# Patient Record
Sex: Female | Born: 1980 | Race: Black or African American | Hispanic: No | Marital: Single | Smoking: Current every day smoker
Health system: Southern US, Community
[De-identification: ages and names within clinical notes are randomized; demographics above are authoritative.]

## PROBLEM LIST (undated history)

## (undated) DIAGNOSIS — J45909 Unspecified asthma, uncomplicated: Secondary | ICD-10-CM

## (undated) HISTORY — PX: TUBAL LIGATION: SHX77

---

## 2018-03-17 ENCOUNTER — Other Ambulatory Visit: Payer: Self-pay

## 2018-03-17 ENCOUNTER — Emergency Department: Payer: Self-pay

## 2018-03-17 ENCOUNTER — Encounter: Payer: Self-pay | Admitting: *Deleted

## 2018-03-17 DIAGNOSIS — R091 Pleurisy: Secondary | ICD-10-CM | POA: Insufficient documentation

## 2018-03-17 DIAGNOSIS — F17210 Nicotine dependence, cigarettes, uncomplicated: Secondary | ICD-10-CM | POA: Insufficient documentation

## 2018-03-17 DIAGNOSIS — R0789 Other chest pain: Secondary | ICD-10-CM | POA: Insufficient documentation

## 2018-03-17 DIAGNOSIS — J45909 Unspecified asthma, uncomplicated: Secondary | ICD-10-CM | POA: Insufficient documentation

## 2018-03-17 LAB — CBC
HEMATOCRIT: 35.3 % (ref 35.0–47.0)
HEMOGLOBIN: 11.7 g/dL — AB (ref 12.0–16.0)
MCH: 26.1 pg (ref 26.0–34.0)
MCHC: 33.2 g/dL (ref 32.0–36.0)
MCV: 78.8 fL — AB (ref 80.0–100.0)
Platelets: 309 10*3/uL (ref 150–440)
RBC: 4.48 MIL/uL (ref 3.80–5.20)
RDW: 16.6 % — ABNORMAL HIGH (ref 11.5–14.5)
WBC: 9.4 10*3/uL (ref 3.6–11.0)

## 2018-03-17 LAB — POCT PREGNANCY, URINE: PREG TEST UR: NEGATIVE

## 2018-03-17 LAB — BASIC METABOLIC PANEL
ANION GAP: 7 (ref 5–15)
BUN: 14 mg/dL (ref 6–20)
CHLORIDE: 105 mmol/L (ref 101–111)
CO2: 24 mmol/L (ref 22–32)
Calcium: 9 mg/dL (ref 8.9–10.3)
Creatinine, Ser: 0.75 mg/dL (ref 0.44–1.00)
GFR calc Af Amer: >60 mL/min (ref >60)
GFR calc non Af Amer: >60 mL/min (ref >60)
GLUCOSE: 87 mg/dL (ref 65–99)
POTASSIUM: 3.9 mmol/L (ref 3.5–5.1)
SODIUM: 136 mmol/L (ref 135–145)

## 2018-03-17 LAB — TROPONIN I: Troponin I: <0.03 ng/mL (ref <0.03)

## 2018-03-17 NOTE — ED Triage Notes (Signed)
Pt reports midline chest pain when she woke up around 3pm, no sob or dizziness. Pain changes with movement.

## 2018-03-18 ENCOUNTER — Emergency Department
Admission: EM | Admit: 2018-03-18 | Discharge: 2018-03-18 | Disposition: A | Discharge status: Home / Self Care | Payer: Self-pay | Attending: Emergency Medicine | Admitting: Emergency Medicine

## 2018-03-18 DIAGNOSIS — R091 Pleurisy: Secondary | ICD-10-CM

## 2018-03-18 DIAGNOSIS — R079 Chest pain, unspecified: Secondary | ICD-10-CM

## 2018-03-18 HISTORY — DX: Unspecified asthma, uncomplicated: J45.909

## 2018-03-18 MED ORDER — KETOROLAC TROMETHAMINE 10 MG PO TABS
10.0000 mg | ORAL_TABLET | Freq: Once | ORAL | Status: AC
  Administered 2018-03-18: 10 mg via ORAL
  Filled 2018-03-18: qty 1

## 2018-03-18 MED ORDER — IBUPROFEN 400 MG PO TABS: 400.0000 mg | ORAL_TABLET | Freq: Once | ORAL | Status: DC

## 2018-03-18 NOTE — ED Provider Notes (Signed)
[facility] Emergency Department Provider Note   First MD Initiated Contact with Patient 03/18/18 [id number]     (approximate)  I have reviewed the triage vital signs and the nursing notes.   HISTORY  Chief Complaint Chest Pain   HPI Theresa Drake is a 37 y.o. female the emergency department with history of awakening at 3 PM yesterday afternoon with midline chest discomfort that is worse with movement.  Patient states that the pain is nonradiating.  Patient denies any dyspnea no diaphoresis or dizziness.  Patient denies any palpitations.  Patient denies any lower extremity pain or swelling.   Past Medical History:  Diagnosis Date  . Asthma     There are no active problems to display for this patient.   Past Surgical History:  Procedure Laterality Date  . TUBAL LIGATION      Prior to Admission medications   Not on File    Allergies No known drug allergies No family history on file.  Social History Social History   Tobacco Use  . Smoking status: Current Every Day Smoker  Substance Use Topics  . Alcohol use: Yes  . Drug use: Never    Review of Systems Constitutional: No fever/chills Eyes: No visual changes. ENT: No sore throat. Cardiovascular: Positive for chest pain. Respiratory: Denies shortness of breath. Gastrointestinal: No abdominal pain.  No nausea, no vomiting.  No diarrhea.  No constipation. Genitourinary: Negative for dysuria. Musculoskeletal: Negative for neck pain.  Negative for back pain. Integumentary: Negative for rash. Neurological: Negative for headaches, focal weakness or numbness.  ____________________________________________   PHYSICAL EXAM:  VITAL SIGNS: ED Triage Vitals  Enc Vitals Group     BP 03/17/18 2254 (!) 144/94     Pulse Rate 03/17/18 2254 93     Resp 03/17/18 2254 18     Temp 03/17/18 2254 98.9 F (37.2 C)     Temp Source 03/17/18 2254 Oral     SpO2 03/17/18 2254 100 %     Weight 03/17/18 2255  123.4 kg (272 lb)     Height 03/17/18 2255 1.676 m (5\' 6" )     Head Circumference --      Peak Flow --      Pain Score 03/17/18 2320 7     Pain Loc --      Pain Edu? --      Excl. in GC? --     Constitutional: Alert and oriented. Well appearing and in no acute distress. Eyes: Conjunctivae are normal.  Head: Atraumatic. Mouth/Throat: Mucous membranes are moist.  Oropharynx non-erythematous. Neck: No stridor.  Cardiovascular: Normal rate, regular rhythm. Good peripheral circulation. Grossly normal heart sounds. Respiratory: Normal respiratory effort.  No retractions. Lungs CTAB. Gastrointestinal: Soft and nontender. No distention.  Musculoskeletal: No lower extremity tenderness nor edema. No gross deformities of extremities. Neurologic:  Normal speech and language. No gross focal neurologic deficits are appreciated.  Skin:  Skin is warm, dry and intact. No rash noted. Psychiatric: Mood and affect are normal. Speech and behavior are normal.  ____________________________________________   LABS (all labs ordered are listed, but only abnormal results are displayed)  Labs Reviewed  CBC - Abnormal; Notable for the following components:      Result Value   Hemoglobin 11.7 (*)    MCV 78.8 (*)    RDW 16.6 (*)    All other components within normal limits  BASIC METABOLIC PANEL  TROPONIN I  POC URINE PREG, ED  POCT PREGNANCY, URINE  ____________________________________________  EKG  ED ECG REPORT I, [place], the attending physician, personally viewed and interpreted this ECG.   Date: 03/18/2018  EKG Time: 10:46 PM  Rate: 97  Rhythm: Normal sinus rhythm  Axis: Normal  Intervals: Normal  ST&T Change: None  ____________________________________________  RADIOLOGY I, [place] Tola Meas, personally viewed and evaluated these images (plain radiographs) as part of my medical decision making, as well as reviewing the written report by the radiologist.  ED MD  interpretation: Mild peribronchial thickening of uncertain density per radiologist.  Official radiology report(s): Dg Chest 2 View  Result Date: 03/17/2018 CLINICAL DATA:  Acute chest pain. EXAM: CHEST - 2 VIEW COMPARISON:  None. FINDINGS: The cardiomediastinal silhouette is unremarkable. Mild peribronchial thickening noted. There is no evidence of focal airspace disease, pulmonary edema, suspicious pulmonary nodule/mass, pleural effusion, or pneumothorax. No acute bony abnormalities are identified. IMPRESSION: Mild peribronchial thickening of uncertain chronicity. No other significant abnormalities. Electronically Signed   By: Harmon Pier M.D.   On: 03/17/2018 23:48     Procedures   ____________________________________________   INITIAL IMPRESSION / ASSESSMENT AND PLAN / ED COURSE  As part of my medical decision making, I reviewed the following data within the electronic MEDICAL RECORD NUMBER   37 year old female presented with above-stated history and physical exam of central chest discomfort that was reproducible on exam.  Consider possibility of pleurisy or costochondritis which I suspect to be likely.  However also consider the possibility of CAD and as such EKG was performed which revealed no evidence of ischemia or infarction likewise troponin was negative.    ____________________________________________  FINAL CLINICAL IMPRESSION(S) / ED DIAGNOSES  Final diagnoses:  Pleurisy  Nonspecific chest pain     MEDICATIONS GIVEN DURING THIS VISIT:  Medications  ketorolac (TORADOL) tablet 10 mg (10 mg Oral Given 03/18/18 0429)     ED Discharge Orders    None       Note:  This document was prepared using Dragon voice recognition software and may include unintentional dictation errors.    [name], MD 03/18/18 [phone]

## 2018-06-30 ENCOUNTER — Emergency Department: Payer: Self-pay

## 2018-06-30 ENCOUNTER — Encounter: Payer: Self-pay | Admitting: Emergency Medicine

## 2018-06-30 ENCOUNTER — Other Ambulatory Visit: Payer: Self-pay

## 2018-06-30 ENCOUNTER — Emergency Department
Admission: EM | Admit: 2018-06-30 | Discharge: 2018-06-30 | Disposition: A | Discharge status: Home / Self Care | Payer: Self-pay | Attending: Emergency Medicine | Admitting: Emergency Medicine

## 2018-06-30 DIAGNOSIS — J45909 Unspecified asthma, uncomplicated: Secondary | ICD-10-CM | POA: Insufficient documentation

## 2018-06-30 DIAGNOSIS — M79672 Pain in left foot: Secondary | ICD-10-CM

## 2018-06-30 DIAGNOSIS — F17200 Nicotine dependence, unspecified, uncomplicated: Secondary | ICD-10-CM | POA: Insufficient documentation

## 2018-06-30 MED ORDER — DICLOFENAC SODIUM 75 MG PO TBEC: 75.0000 mg | DELAYED_RELEASE_TABLET | Freq: Two times a day (BID) | ORAL | 0 refills | Status: DC

## 2018-06-30 MED ORDER — DEXAMETHASONE SODIUM PHOSPHATE 10 MG/ML IJ SOLN
10.0000 mg | Freq: Once | INTRAMUSCULAR | Status: AC
  Administered 2018-06-30: 10 mg via INTRAMUSCULAR
  Filled 2018-06-30: qty 1

## 2018-06-30 NOTE — ED Notes (Signed)
Pt verbalizes understanding of discharge instructions.

## 2018-06-30 NOTE — ED Provider Notes (Signed)
[facility] Emergency Department Provider Note ____________________________________________  Time seen: 2155  I have reviewed the triage vital signs and the nursing notes.  HISTORY  Chief Complaint  Foot Pain and Leg Pain   HPI Theresa Drake is a 37 y.o. female's to the ER with complaints of left foot pain.  She reports this started yesterday.  She describes the pain as sharp and shooting.  The pain radiates up the side of her left calf.  She reports associated numbness, tingling and weakness in the left foot.  She is having pain with weightbearing.  She denies any injury to the area.  She has taken ibuprofen without any relief.  She denies any prior injuries or surgeries to the left foot or ankle.  Past Medical History:  Diagnosis Date  . Asthma     There are no active problems to display for this patient.   Past Surgical History:  Procedure Laterality Date  . TUBAL LIGATION      Prior to Admission medications   Medication Sig Start Date End Date Taking? Authorizing Provider  diclofenac (VOLTAREN) 75 MG EC tablet Take 1 tablet (75 mg total) by mouth 2 (two) times daily. 06/30/18   Lorre MunroeBaity, Regina W, NP    Allergies Patient has no known allergies.  No family history on file.  Social History Social History   Tobacco Use  . Smoking status: Current Every Day Smoker  Substance Use Topics  . Alcohol use: Yes  . Drug use: Never    Review of Systems  Constitutional: Negative for fever chills or body aches. Cardiovascular: Negative for swelling in the left calf.Marland Kitchen. Respiratory: Negative for shortness of breath. Musculoskeletal: Positive for left foot pain, difficulty with gait. Skin: Negative for redness, warmth. Neurological: Positive for numbness, tingling and weakness of the left lower extremity.   ____________________________________________  PHYSICAL EXAM:  VITAL SIGNS: ED Triage Vitals  Enc Vitals Group     BP 06/30/18 2039 128/74   Pulse Rate 06/30/18 2039 (!) 101     Resp 06/30/18 2039 20     Temp 06/30/18 2039 99.8 F (37.7 C)     Temp Source 06/30/18 2039 Oral     SpO2 06/30/18 2039 99 %     Weight 06/30/18 2040 286 lb 13.1 oz (130.1 kg)     Height 06/30/18 2040 5\' 6"  (1.676 m)     Head Circumference --      Peak Flow --      Pain Score 06/30/18 2040 9     Pain Loc --      Pain Edu? --      Excl. in GC? --     Constitutional: Alert and oriented. Obese, in no distress. Cardiovascular: Echocardiac, regular rhythm.  Pedal pulses 2+ bilaterally. Respiratory: Normal respiratory effort. No wheezes/rales/rhonchi. Musculoskeletal: Normal flexion, extension and rotation of the left ankle.  No pain with palpation of the left ankle.  No joint swelling noted.  Pain with palpation over the dorsal aspect of the left foot, arch.  No pain with palpation of the left heel.  She reports that she is not able to ambulate and will not try. Neurologic: Sensation intact to bilateral lower extremities. Skin:  Skin is warm, dry and intact. No redness or warmth noted. ____________________________________________   RADIOLOGY  Imaging Orders     DG Foot Complete Left  IMPRESSION: Negative. ____________________________________________  PROCEDURES  .Splint Application Date/Time: 06/30/2018 10:07 PM Performed by: Leticia ClasHayden, Rachel G, RN Authorized by: Sampson SiBaity,  Salvadore Oxford, NP   Consent:    Consent obtained:  Verbal   Consent given by:  Patient   Risks discussed:  Discoloration, numbness and pain   Alternatives discussed:  No treatment Pre-procedure details:    Sensation:  Normal Procedure details:    Laterality:  Left   Location:  Ankle   Ankle:  L ankle   Strapping: no     Splint type:  Ankle stirrup Post-procedure details:    Pain:  Improved   Sensation:  Normal   Patient tolerance of procedure:  Tolerated well, no immediate complications    ____________________________________________  INITIAL IMPRESSION / ASSESSMENT  AND PLAN / ED COURSE  Left Foot Pain:  DDX include fracture, sprain, plantar fasciitis, osteoarthritis Xray negative for acute fracture Decadron 10 mg IM today Stirrup ankle brace applied RX for Diclofenac 75 mg for pain and inflammation Encouraged rest, ice and elevation Work note provided  ____________________________________________  FINAL CLINICAL IMPRESSION(S) / ED DIAGNOSES  Final diagnoses:  Foot pain, left      Lorre Munroe, NP 06/30/18 2212    Dionne Bucy, MD 07/01/18 [phone]

## 2018-06-30 NOTE — Discharge Instructions (Signed)
You have been diagnosed with foot pain.  The x-ray of your left foot was negative for acute fracture.  We gave you a steroid injection today.  We have placed your left foot in an ankle brace for extra support.  I have provided you with a prescription for Diclofenac for pain and inflammation.  Avoid over-the-counter anti-inflammatories.  You can take Tylenol thousand milligrams every 8 hours as needed.

## 2018-06-30 NOTE — ED Triage Notes (Signed)
Pt reports pain to left foot reports has sharp pain that radiates to the leg, pt denies any injury to area. Pt talks in complete sentences no distress noted

## 2018-09-11 ENCOUNTER — Other Ambulatory Visit: Payer: Self-pay

## 2018-09-11 ENCOUNTER — Encounter: Payer: Self-pay | Admitting: Emergency Medicine

## 2018-09-11 ENCOUNTER — Emergency Department
Admission: EM | Admit: 2018-09-11 | Discharge: 2018-09-11 | Disposition: A | Discharge status: Home / Self Care | Payer: Self-pay | Attending: Emergency Medicine | Admitting: Emergency Medicine

## 2018-09-11 DIAGNOSIS — J45909 Unspecified asthma, uncomplicated: Secondary | ICD-10-CM | POA: Insufficient documentation

## 2018-09-11 DIAGNOSIS — Y93E5 Activity, floor mopping and cleaning: Secondary | ICD-10-CM | POA: Insufficient documentation

## 2018-09-11 DIAGNOSIS — S01412A Laceration without foreign body of left cheek and temporomandibular area, initial encounter: Secondary | ICD-10-CM | POA: Insufficient documentation

## 2018-09-11 DIAGNOSIS — Z79899 Other long term (current) drug therapy: Secondary | ICD-10-CM | POA: Insufficient documentation

## 2018-09-11 DIAGNOSIS — W25XXXA Contact with sharp glass, initial encounter: Secondary | ICD-10-CM | POA: Insufficient documentation

## 2018-09-11 DIAGNOSIS — Y998 Other external cause status: Secondary | ICD-10-CM | POA: Insufficient documentation

## 2018-09-11 DIAGNOSIS — S0181XA Laceration without foreign body of other part of head, initial encounter: Secondary | ICD-10-CM

## 2018-09-11 DIAGNOSIS — Y92008 Other place in unspecified non-institutional (private) residence as the place of occurrence of the external cause: Secondary | ICD-10-CM | POA: Insufficient documentation

## 2018-09-11 DIAGNOSIS — F17200 Nicotine dependence, unspecified, uncomplicated: Secondary | ICD-10-CM | POA: Insufficient documentation

## 2018-09-11 NOTE — Discharge Instructions (Addendum)
Keep the wound clean and dry. Avoid use of ointments, creams, or lotions over the wound glue.

## 2018-09-11 NOTE — ED Triage Notes (Signed)
Small laceration / abrasion to left upper cheek.  Bleeding controlled.

## 2018-09-11 NOTE — ED Provider Notes (Signed)
[facility] Emergency Department Provider Note ____________________________________________  Time seen: 1820  I have reviewed the triage vital signs and the nursing notes.  HISTORY  Chief Complaint  Laceration  HPI Theresa Drake is a 37 y.o. female who presents to the ED accompanied by her mother, for evaluation of small laceration to the left cheek.  Patient was at home cleaning and changing the bed sheets, when she accidentally knocked the glass lampshade off of the overhead light.  The glass shade fell hitting the patient on her left cheek.  She presents now with a small laceration to the cheek without active bleeding.  She denies any other injury at this time..  Past Medical History:  Diagnosis Date  . Asthma     There are no active problems to display for this patient.   Past Surgical History:  Procedure Laterality Date  . TUBAL LIGATION      Prior to Admission medications   Medication Sig Start Date End Date Taking? Authorizing Provider  diclofenac (VOLTAREN) 75 MG EC tablet Take 1 tablet (75 mg total) by mouth 2 (two) times daily. 06/30/18   Lorre MunroeBaity, Regina W, NP   Allergies Patient has no known allergies.  No family history on file.  Social History Social History   Tobacco Use  . Smoking status: Current Every Day Smoker  . Smokeless tobacco: Never Used  Substance Use Topics  . Alcohol use: Yes  . Drug use: Never    Review of Systems  Constitutional: Negative for fever. Eyes: Negative for visual changes. ENT: Negative for sore throat. Cardiovascular: Negative for chest pain. Respiratory: Negative for shortness of breath. Musculoskeletal: Negative for back pain. Skin: Negative for rash.  Facial laceration as above. Neurological: Negative for headaches, focal weakness or numbness. ____________________________________________  PHYSICAL EXAM:  VITAL SIGNS: ED Triage Vitals  Enc Vitals Group     BP 09/11/18 1802 126/85     Pulse  Rate 09/11/18 1802 95     Resp 09/11/18 1802 18     Temp 09/11/18 1802 98.7 F (37.1 C)     Temp Source 09/11/18 1802 Oral     SpO2 09/11/18 1802 99 %     Weight 09/11/18 1758 286 lb 13.1 oz (130.1 kg)     Height --      Head Circumference --      Peak Flow --      Pain Score 09/11/18 1757 3     Pain Loc --      Pain Edu? --      Excl. in GC? --     Constitutional: Alert and oriented. Well appearing and in no distress. Head: Normocephalic and atraumatic, except for a superficial, 1 cm laceration to the left malar cheek.  There is a small drop of blood noted in the fresh wound bed.  No other injuries appreciated.  Subtle swelling is noted.. Eyes: Conjunctivae are normal. PERRL. Normal extraocular movements Neck: Supple. Normal ROM Cardiovascular: Normal rate, regular rhythm. Normal distal pulses. Respiratory: Normal respiratory effort.  Musculoskeletal: Nontender with normal range of motion in all extremities.  Neurologic:  Normal gait without ataxia. Normal speech and language. No gross focal neurologic deficits are appreciated. Skin:  Skin is warm, dry and intact. No rash noted. ____________________________________________  PROCEDURES  .Marland Kitchen.Laceration Repair Date/Time: 09/11/2018 7:17 PM Performed by: Lissa HoardMenshew, Florentino Laabs V Bacon, PA-C Authorized by: Lissa HoardMenshew, Dewain Platz V Bacon, PA-C   Consent:    Consent obtained:  Verbal   Consent given by:  Patient   Risks discussed:  Poor cosmetic result and poor wound healing   Alternatives discussed:  No treatment Laceration details:    Location:  Face   Face location:  L cheek   Length (cm):  1.5 Repair type:    Repair type:  Simple Treatment:    Area cleansed with:  Soap and water   Amount of cleaning:  Standard Skin repair:    Repair method:  Tissue adhesive Approximation:    Approximation:  Close Post-procedure details:    Dressing:  Open (no dressing)   Patient tolerance of procedure:  Tolerated well, no immediate  complications  ____________________________________________  INITIAL IMPRESSION / ASSESSMENT AND PLAN / ED COURSE  She with ED evaluation of a minor facial contusion resulting in a superficial laceration to the left cheek.  Patient opted for wound repair and we decided this wound adhesive was the best option.  Patient is discharged with wound care instructions regarding wound adhesive.  She will follow-up with primary provider or return to the ED as needed. ____________________________________________  FINAL CLINICAL IMPRESSION(S) / ED DIAGNOSES  Final diagnoses:  Facial laceration, initial encounter      Lissa Hoard, PA-C 09/11/18 1918    Jeanmarie Plant, MD 09/13/18 [phone]

## 2018-09-11 NOTE — ED Notes (Signed)
See triage note  Presents with a small laceration to left cheek. Also has some tenderness to area  States she was fanning the sheets  ..hit a light and it feel on her face

## 2018-12-29 ENCOUNTER — Emergency Department: Payer: Self-pay

## 2018-12-29 ENCOUNTER — Other Ambulatory Visit: Payer: Self-pay

## 2018-12-29 ENCOUNTER — Emergency Department
Admission: EM | Admit: 2018-12-29 | Discharge: 2018-12-29 | Disposition: A | Discharge status: Home / Self Care | Payer: Self-pay | Attending: Emergency Medicine | Admitting: Emergency Medicine

## 2018-12-29 DIAGNOSIS — J45909 Unspecified asthma, uncomplicated: Secondary | ICD-10-CM | POA: Insufficient documentation

## 2018-12-29 DIAGNOSIS — F17200 Nicotine dependence, unspecified, uncomplicated: Secondary | ICD-10-CM | POA: Insufficient documentation

## 2018-12-29 DIAGNOSIS — R070 Pain in throat: Secondary | ICD-10-CM | POA: Insufficient documentation

## 2018-12-29 DIAGNOSIS — R0989 Other specified symptoms and signs involving the circulatory and respiratory systems: Secondary | ICD-10-CM | POA: Insufficient documentation

## 2018-12-29 DIAGNOSIS — Z0279 Encounter for issue of other medical certificate: Secondary | ICD-10-CM | POA: Insufficient documentation

## 2018-12-29 DIAGNOSIS — B349 Viral infection, unspecified: Secondary | ICD-10-CM | POA: Insufficient documentation

## 2018-12-29 LAB — BASIC METABOLIC PANEL
Anion gap: 8 (ref 5–15)
BUN: 10 mg/dL (ref 6–20)
CO2: 24 mmol/L (ref 22–32)
CREATININE: 0.8 mg/dL (ref 0.44–1.00)
Calcium: 8.8 mg/dL — ABNORMAL LOW (ref 8.9–10.3)
Chloride: 106 mmol/L (ref 98–111)
GFR calc Af Amer: >60 mL/min (ref >60)
GFR calc non Af Amer: >60 mL/min (ref >60)
Glucose, Bld: 110 mg/dL — ABNORMAL HIGH (ref 70–99)
Potassium: 3.8 mmol/L (ref 3.5–5.1)
Sodium: 138 mmol/L (ref 135–145)

## 2018-12-29 LAB — CBC
HEMATOCRIT: 35.3 % — AB (ref 36.0–46.0)
Hemoglobin: 11 g/dL — ABNORMAL LOW (ref 12.0–15.0)
MCH: 25.9 pg — ABNORMAL LOW (ref 26.0–34.0)
MCHC: 31.2 g/dL (ref 30.0–36.0)
MCV: 83.3 fL (ref 80.0–100.0)
Platelets: 288 10*3/uL (ref 150–400)
RBC: 4.24 MIL/uL (ref 3.87–5.11)
RDW: 17.5 % — AB (ref 11.5–15.5)
WBC: 6.9 10*3/uL (ref 4.0–10.5)
nRBC: 0 % (ref 0.0–0.2)

## 2018-12-29 LAB — POCT PREGNANCY, URINE: Preg Test, Ur: NEGATIVE

## 2018-12-29 LAB — TROPONIN I: Troponin I: <0.03 ng/mL (ref <0.03)

## 2018-12-29 NOTE — ED Notes (Signed)
Pt tolerated food well, states she feels a little better

## 2018-12-29 NOTE — ED Provider Notes (Signed)
[facility] Emergency Department Provider Note  ____________________________________________   I have reviewed the triage vital signs and the nursing notes. Where available I have reviewed prior notes and, if possible and indicated, outside hospital notes.    HISTORY  Chief Complaint No chief complaint on file.    HPI Theresa Drake is a 38 y.o. female  With a history of asthma, states that she began having a cough a few hours ago and left work because of it.  She also has a sore throat and runny nose.  No fever.  Sometimes is uncomfortable to cough but she has no other chest pain.  The cough is sometimes productive but mostly is not.  It is difficult for her to tell me too much about this because is only been going on for a few hours, she states.  When I initially entered the room she is very upset because she really had to urinate, and she was throwing all of the stuff on the floor, leads in blood pressure cuff, but shortly thereafter I was able to calm her down and we had a more reasonable discussion.  Patient states she needs a work note and she is very hungry and she wants [place] to feed her because she has not had any food since she went to work this morning.  She also complains of runny nose.  No recent travel no coronavirus exposure does not meet criteria currently for testing,    Past Medical History:  Diagnosis Date  . Asthma     There are no active problems to display for this patient.   Past Surgical History:  Procedure Laterality Date  . TUBAL LIGATION      Prior to Admission medications   Medication Sig Start Date End Date Taking? Authorizing Provider  diclofenac (VOLTAREN) 75 MG EC tablet Take 1 tablet (75 mg total) by mouth 2 (two) times daily. 06/30/18   Lorre Munroe, NP    Allergies Patient has no known allergies.  History reviewed. No pertinent family history.  Social History Social History   Tobacco Use  . Smoking status: Current  Every Day Smoker  . Smokeless tobacco: Never Used  Substance Use Topics  . Alcohol use: Yes  . Drug use: Never    Review of Systems Constitutional: No fever/chills Eyes: No visual changes. ENT: No sore throat. No stiff neck no neck pain Cardiovascular: Denies chest pain.  Positive "it hurts to cough sometimes" Respiratory: Denies shortness of breath.  Positive cough Gastrointestinal:   no vomiting.  No diarrhea.  No constipation. Genitourinary: Negative for dysuria. Musculoskeletal: Negative lower extremity swelling Skin: Negative for rash. Neurological: Negative for severe headaches, focal weakness or numbness.   ____________________________________________   PHYSICAL EXAM:  VITAL SIGNS: ED Triage Vitals  Enc Vitals Group     BP 12/29/18 1052 125/87     Pulse Rate 12/29/18 1052 81     Resp 12/29/18 1052 18     Temp 12/29/18 1052 98.2 F (36.8 C)     Temp src --      SpO2 12/29/18 1052 99 %     Weight 12/29/18 1046 278 lb (126.1 kg)     Height 12/29/18 1046 5\' 6"  (1.676 m)     Head Circumference --      Peak Flow --      Pain Score 12/29/18 1046 7     Pain Loc --      Pain Edu? --  Excl. in GC? --     Constitutional: Alert and oriented. Well appearing and in no acute distress. Eyes: Conjunctivae are normal Head: Atraumatic HEENT: + congestion/rhinnorhea. Mucous membranes are moist.  Oropharynx non-erythematous Neck:   Nontender with no meningismus, no masses, no stridor Cardiovascular: Normal rate, regular rhythm. Grossly normal heart sounds.  Good peripheral circulation. Respiratory: Normal respiratory effort.  No retractions. Lungs CTAB. Abdominal: Soft and nontender. No distention. No guarding no rebound Back:  There is no focal tenderness or step off.  there is no midline tenderness there are no lesions noted. there is no CVA tenderness Musculoskeletal: No lower extremity tenderness, no upper extremity tenderness. No joint effusions, no DVT signs strong  distal pulses no edema Neurologic:  Normal speech and language. No gross focal neurologic deficits are appreciated.  Skin:  Skin is warm, dry and intact. No rash noted. Psychiatric: Mood and affect are normal. Speech and behavior are normal.  ____________________________________________   LABS (all labs ordered are listed, but only abnormal results are displayed)  Labs Reviewed  BASIC METABOLIC PANEL - Abnormal; Notable for the following components:      Result Value   Glucose, Bld 110 (*)    Calcium 8.8 (*)    All other components within normal limits  CBC - Abnormal; Notable for the following components:   Hemoglobin 11.0 (*)    HCT 35.3 (*)    MCH 25.9 (*)    RDW 17.5 (*)    All other components within normal limits  TROPONIN I  POC URINE PREG, ED  POCT PREGNANCY, URINE    Pertinent labs  results that were available during my care of the patient were reviewed by me and considered in my medical decision making (see chart for details). ____________________________________________  EKG  I personally interpreted any EKGs ordered by me or triage Sinus rhythm, rate 81 bpm no acute ST elevation or depression nonspecific ST changes no acute ischemia changed essentially from June 2019 ____________________________________________  RADIOLOGY  Pertinent labs & imaging results that were available during my care of the patient were reviewed by me and considered in my medical decision making (see chart for details). If possible, patient and/or family made aware of any abnormal findings.  Dg Chest 2 View  Result Date: 12/29/2018 CLINICAL DATA:  Chest pain and shortness of breath EXAM: CHEST - 2 VIEW COMPARISON:  03/17/2018 FINDINGS: Normal heart size and mediastinal contours. No acute infiltrate or edema. No effusion or pneumothorax. No acute osseous findings. Irregular density over the right neck is likely from the patient's hair. IMPRESSION: No active cardiopulmonary disease.  Electronically Signed   By: Marnee Spring M.D.   On: 12/29/2018 11:12   ____________________________________________    PROCEDURES  Procedure(s) performed: None  Procedures  Critical Care performed: None  ____________________________________________   INITIAL IMPRESSION / ASSESSMENT AND PLAN / ED COURSE  Pertinent labs & imaging results that were available during my care of the patient were reviewed by me and considered in my medical decision making (see chart for details).  Patient here with cold symptoms for a few hours.  She is angry and would like to eat we will let her eat she is eager to go home she wants a work note.  At this time I see nothing more acute than the common cold.  Nothing to suggest ACS PE dissection myocarditis endocarditis pneumonia pneumothorax or intra-abdominal pathology causing her her symptoms.  Nor is there any indication for antibiotics.  Patient has a  few hours of URI symptoms.  She does not meet criteria for testing for coronavirus at this time, we will discharge her with return precautions and follow-up given and understood.    ____________________________________________   FINAL CLINICAL IMPRESSION(S) / ED DIAGNOSES  Final diagnoses:  None      This chart was dictated using voice recognition software.  Despite best efforts to proofread,  errors can occur which can change meaning.      Jeanmarie Plant, MD 12/29/18 1302

## 2018-12-29 NOTE — ED Notes (Signed)
Pt provided sandwich tray and sprite

## 2018-12-29 NOTE — ED Triage Notes (Signed)
Pt arrives to ED with mult complaints. Generalized CP, SOB, bronchitis, body pain, sore throat, cough. States symptoms began this AM. A&O, no distress noted.

## 2018-12-30 ENCOUNTER — Other Ambulatory Visit: Payer: Self-pay

## 2018-12-30 ENCOUNTER — Emergency Department
Admission: EM | Admit: 2018-12-30 | Discharge: 2018-12-30 | Disposition: A | Discharge status: Home / Self Care | Payer: Self-pay | Attending: Emergency Medicine | Admitting: Emergency Medicine

## 2018-12-30 ENCOUNTER — Encounter: Payer: Self-pay | Admitting: Emergency Medicine

## 2018-12-30 DIAGNOSIS — J45909 Unspecified asthma, uncomplicated: Secondary | ICD-10-CM | POA: Insufficient documentation

## 2018-12-30 DIAGNOSIS — F17210 Nicotine dependence, cigarettes, uncomplicated: Secondary | ICD-10-CM | POA: Insufficient documentation

## 2018-12-30 DIAGNOSIS — Z79899 Other long term (current) drug therapy: Secondary | ICD-10-CM | POA: Insufficient documentation

## 2018-12-30 DIAGNOSIS — J209 Acute bronchitis, unspecified: Secondary | ICD-10-CM | POA: Insufficient documentation

## 2018-12-30 LAB — INFLUENZA PANEL BY PCR (TYPE A & B)
Influenza A By PCR: NEGATIVE
Influenza B By PCR: NEGATIVE

## 2018-12-30 MED ORDER — ALBUTEROL SULFATE HFA 108 (90 BASE) MCG/ACT IN AERS: 2.0000 | INHALATION_SPRAY | RESPIRATORY_TRACT | 1 refills | Status: DC | PRN

## 2018-12-30 MED ORDER — PREDNISONE 10 MG PO TABS: 50.0000 mg | ORAL_TABLET | Freq: Every day | ORAL | 0 refills | Status: DC

## 2018-12-30 MED ORDER — PREDNISONE 20 MG PO TABS
60.0000 mg | ORAL_TABLET | Freq: Once | ORAL | Status: AC
  Administered 2018-12-30: 60 mg via ORAL
  Filled 2018-12-30: qty 3

## 2018-12-30 MED ORDER — IPRATROPIUM-ALBUTEROL 0.5-2.5 (3) MG/3ML IN SOLN
3.0000 mL | Freq: Once | RESPIRATORY_TRACT | Status: AC
  Administered 2018-12-30: 3 mL via RESPIRATORY_TRACT
  Filled 2018-12-30: qty 6

## 2018-12-30 MED ORDER — HYDROCOD POLST-CPM POLST ER 10-8 MG/5ML PO SUER
5.0000 mL | Freq: Once | ORAL | Status: AC
  Administered 2018-12-30: 5 mL via ORAL
  Filled 2018-12-30: qty 5

## 2018-12-30 MED ORDER — GUAIFENESIN-CODEINE 100-10 MG/5ML PO SYRP: 10.0000 mL | ORAL_SOLUTION | Freq: Three times a day (TID) | ORAL | 0 refills | Status: DC | PRN

## 2018-12-30 NOTE — Discharge Instructions (Addendum)
Please follow up with the primary care provider of your choice for symptoms that are not improving over the next few days. ° °Return to the ER for symptoms that change or worsen if unable to schedule an appointment. °

## 2018-12-30 NOTE — ED Notes (Signed)
Patient did not sign for discharge. Patient given papers while she was walkign down the hall to leave

## 2018-12-30 NOTE — ED Provider Notes (Signed)
[facility] Emergency Department Provider Note  ____________________________________________  Time seen: Approximately 2:11 PM  I have reviewed the triage vital signs and the nursing notes.   HISTORY  Chief Complaint Dizziness and Generalized Body Aches   HPI Theresa Drake is a 38 y.o. female who presents to the emergency department for treatment and evaluation of cough, chest tenderness with cough, and shortness of breath.  No fever that she is noted.   Symptoms started yesterday with a runny nose and sore throat and cough which has worsened.  She was evaluated here yesterday for the symptoms.  She states that she was not given any prescriptions yesterday.   Past Medical History:  Diagnosis Date  . Asthma     There are no active problems to display for this patient.   Past Surgical History:  Procedure Laterality Date  . TUBAL LIGATION      Prior to Admission medications   Medication Sig Start Date End Date Taking? Authorizing Provider  albuterol (PROVENTIL HFA;VENTOLIN HFA) 108 (90 Base) MCG/ACT inhaler Inhale 2 puffs into the lungs every 4 (four) hours as needed for wheezing or shortness of breath. 12/30/18   Kemper Heupel, Rulon Eisenmenger B, FNP  diclofenac (VOLTAREN) 75 MG EC tablet Take 1 tablet (75 mg total) by mouth 2 (two) times daily. 06/30/18   Lorre Munroe, NP  guaiFENesin-codeine (ROBITUSSIN AC) 100-10 MG/5ML syrup Take 10 mLs by mouth 3 (three) times daily as needed for cough. 12/30/18   Aleera Gilcrease B, FNP  predniSONE (DELTASONE) 10 MG tablet Take 5 tablets (50 mg total) by mouth daily. 12/30/18   Chinita Pester, FNP    Allergies Patient has no known allergies.  No family history on file.  Social History Social History   Tobacco Use  . Smoking status: Current Every Day Smoker  . Smokeless tobacco: Never Used  Substance Use Topics  . Alcohol use: Yes  . Drug use: Never    Review of Systems Constitutional: Negative for fever/chills.   Normal appetite. ENT: Positive for sore throat. Cardiovascular: Denies chest pain. Respiratory: Positive for shortness of breath.  Positive for cough.  Positive for wheezing.  Gastrointestinal: Negative for nausea, no vomiting.  No diarrhea.  Musculoskeletal: Negative for body aches Skin: Negative for rash. Neurological: Negative for headaches ____________________________________________   PHYSICAL EXAM:  VITAL SIGNS: ED Triage Vitals [12/30/18 1301]  Enc Vitals Group     BP (!) 140/99     Pulse Rate 81     Resp 16     Temp 98.2 F (36.8 C)     Temp Source Oral     SpO2 96 %     Weight 278 lb (126.1 kg)     Height 5\' 6"  (1.676 m)     Head Circumference      Peak Flow      Pain Score 9     Pain Loc      Pain Edu?      Excl. in GC?     Constitutional: Alert and oriented.  Acutely ill appearing and in no acute distress. Eyes: Conjunctivae are normal. Ears: Bilateral tympanic membranes are normal Nose: Maxillary sinus congestion noted; no rhinnorhea. Mouth/Throat: Mucous membranes are moist.  Oropharynx mildly erythematous. Tonsils not visualized. Uvula midline. Neck: No stridor.  Lymphatic: No cervical lymphadenopathy. Cardiovascular: Normal rate, regular rhythm. Good peripheral circulation. Respiratory: Respirations are even and unlabored.  No retractions.  Expiratory wheezes noted throughout. Gastrointestinal: Soft and nontender.  Musculoskeletal: FROM x  4 extremities.  Neurologic:  Normal speech and language. Skin:  Skin is warm, dry and intact. No rash noted. Psychiatric: Mood and affect are normal. Speech and behavior are normal.  ____________________________________________   LABS (all labs ordered are listed, but only abnormal results are displayed)  Labs Reviewed  INFLUENZA PANEL BY PCR (TYPE A & B)   ____________________________________________  EKG  Not indicated ____________________________________________  RADIOLOGY  Not  indicated ____________________________________________   PROCEDURES  Procedure(s) performed: None  Critical Care performed: No ____________________________________________   INITIAL IMPRESSION / ASSESSMENT AND PLAN / ED COURSE  38 y.o. female presents to the emergency department for treatment and evaluation of  cough, shortness of breath, chest pain with cough, and sore throat.  She has not had a fever that she is aware of.  She states that she feels like her asthma is "flaring up."  While here, the patient received a DuoNeb treatment, prednisone, and Tussionex.  Shortly after, patient reports to multiple staff members that she feels better would like to leave.  She will be given prescriptions for the prednisone, albuterol, and guaifenesin with codeine.  She was encouraged to return to the emergency department for symptoms of change or worsen if she is unable to see her primary care provider.  She was also given a work note for today and tomorrow.   Medications  ipratropium-albuterol (DUONEB) 0.5-2.5 (3) MG/3ML nebulizer solution 3 mL (3 mLs Nebulization Given 12/30/18 1433)  predniSONE (DELTASONE) tablet 60 mg (60 mg Oral Given 12/30/18 1433)  chlorpheniramine-HYDROcodone (TUSSIONEX) 10-8 MG/5ML suspension 5 mL (5 mLs Oral Given 12/30/18 1433)    ED Discharge Orders         Ordered    predniSONE (DELTASONE) 10 MG tablet  Daily     12/30/18 1548    albuterol (PROVENTIL HFA;VENTOLIN HFA) 108 (90 Base) MCG/ACT inhaler  Every 4 hours PRN     12/30/18 1548    guaiFENesin-codeine (ROBITUSSIN AC) 100-10 MG/5ML syrup  3 times daily PRN     12/30/18 1548           Pertinent labs & imaging results that were available during my care of the patient were reviewed by me and considered in my medical decision making (see chart for details).    If controlled substance prescribed during this visit, 12 month history viewed on the [place] prior to issuing an initial prescription for Schedule II or  III opiod. ____________________________________________   FINAL CLINICAL IMPRESSION(S) / ED DIAGNOSES  Final diagnoses:  Acute bronchitis, unspecified organism    Note:  This document was prepared using Dragon voice recognition software and may include unintentional dictation errors.    Chinita Pester, FNP 12/30/18 1734    Jene Every, MD 12/31/18 [phone]

## 2018-12-30 NOTE — ED Triage Notes (Signed)
Pt to ED via POV c/o generalized body aches, sore throat, chest pains, and shortness of breath. Pt states that she was seen yesterday but her symptoms are not any better. Pt is in NAD.

## 2019-02-14 ENCOUNTER — Encounter: Payer: Self-pay | Admitting: Emergency Medicine

## 2019-02-14 ENCOUNTER — Other Ambulatory Visit: Payer: Self-pay

## 2019-02-14 ENCOUNTER — Emergency Department
Admission: EM | Admit: 2019-02-14 | Discharge: 2019-02-14 | Disposition: A | Discharge status: Home / Self Care | Payer: Self-pay | Attending: Emergency Medicine | Admitting: Emergency Medicine

## 2019-02-14 DIAGNOSIS — J45909 Unspecified asthma, uncomplicated: Secondary | ICD-10-CM | POA: Insufficient documentation

## 2019-02-14 DIAGNOSIS — M79672 Pain in left foot: Secondary | ICD-10-CM | POA: Insufficient documentation

## 2019-02-14 DIAGNOSIS — F17210 Nicotine dependence, cigarettes, uncomplicated: Secondary | ICD-10-CM | POA: Insufficient documentation

## 2019-02-14 MED ORDER — KETOROLAC TROMETHAMINE 30 MG/ML IJ SOLN
30.0000 mg | Freq: Once | INTRAMUSCULAR | Status: AC
  Administered 2019-02-14: 30 mg via INTRAMUSCULAR
  Filled 2019-02-14: qty 1

## 2019-02-14 MED ORDER — KETOROLAC TROMETHAMINE 10 MG PO TABS: 10.0000 mg | ORAL_TABLET | Freq: Four times a day (QID) | ORAL | 0 refills | Status: DC | PRN

## 2019-02-14 MED ORDER — TRAMADOL HCL 50 MG PO TABS: 50.0000 mg | ORAL_TABLET | Freq: Four times a day (QID) | ORAL | 0 refills | Status: DC | PRN

## 2019-02-14 MED ORDER — LIDOCAINE 5 % EX PTCH: 1.0000 | MEDICATED_PATCH | CUTANEOUS | 0 refills | Status: DC

## 2019-02-14 NOTE — ED Triage Notes (Signed)
Pain left foot. No injury.

## 2019-02-14 NOTE — ED Notes (Signed)
See triage note  Presents with pain to left foot  Denies any injury but states she has intermittent pain and swelling for several weeks    States swelling decreases with elevation  No deformity noted  Good pulses

## 2019-02-14 NOTE — Discharge Instructions (Addendum)
Please take Toradol for inflammation in your foot.  You can take tramadol for extreme pain.  You may use Lidoderm patches to place over the top of your foot.  Use Ace wrap and brace.  Elevate foot today.  Please follow-up with podiatry for further evaluation and management.

## 2019-02-14 NOTE — ED Provider Notes (Signed)
[facility] Emergency Department Provider Note  ____________________________________________  Time seen: Approximately 11:13 AM  I have reviewed the triage vital signs and the nursing notes.   HISTORY  Chief Complaint Foot Pain    HPI Theresa Drake is a 38 y.o. female that presents emergency department for evaluation of intermittent right foot pain for several months.  Pain will start at the top of her foot and shoot into her ankle.  Pain is worse when she applies pressure to her foot.  Her daughter will elevate her foot sometimes at night.  Foot occasionally swells but this is alleviated when her foot is elevated.  Occasionally she will take Motrin for pain.  She had to call out of work today for pain.  Patient was seen for same in fall 2019 in this emergency department.  She had x-rays completed in the emergency department at this time.  She states that pain never improved.  She does not have a primary care provider.  She was not able to follow-up with anyone following her ED visit last year.  She was given a shoe in the emergency department but has lost it.  No calf pain, numbness, tingling.   Past Medical History:  Diagnosis Date  . Asthma     There are no active problems to display for this patient.   Past Surgical History:  Procedure Laterality Date  . TUBAL LIGATION      Prior to Admission medications   Medication Sig Start Date End Date Taking? Authorizing Provider  ketorolac (TORADOL) 10 MG tablet Take 1 tablet (10 mg total) by mouth every 6 (six) hours as needed. 02/14/19   Enid Derry, PA-C  lidocaine (LIDODERM) 5 % Place 1 patch onto the skin daily. Remove & Discard patch within 12 hours or as directed by MD 02/14/19   Enid Derry, PA-C  traMADol (ULTRAM) 50 MG tablet Take 1 tablet (50 mg total) by mouth every 6 (six) hours as needed. 02/14/19 02/14/20  Enid Derry, PA-C    Allergies Patient has no known allergies.  No family history  on file.  Social History Social History   Tobacco Use  . Smoking status: Current Every Day Smoker  . Smokeless tobacco: Never Used  Substance Use Topics  . Alcohol use: Yes  . Drug use: Never     Review of Systems  Gastrointestinal: No nausea, no vomiting.  Musculoskeletal: Positive for foot pain. Skin: Negative for rash, abrasions, lacerations, ecchymosis. Neurological: Negative for numbness or tingling   ____________________________________________   PHYSICAL EXAM:  VITAL SIGNS: ED Triage Vitals  Enc Vitals Group     BP 02/14/19 1056 131/87     Pulse Rate 02/14/19 1056 77     Resp 02/14/19 1056 18     Temp 02/14/19 1056 98.1 F (36.7 C)     Temp Source 02/14/19 1056 Oral     SpO2 02/14/19 1056 96 %     Weight 02/14/19 1056 278 lb (126.1 kg)     Height 02/14/19 1056 5\' 6"  (1.676 m)     Head Circumference --      Peak Flow --      Pain Score 02/14/19 1039 7     Pain Loc --      Pain Edu? --      Excl. in GC? --      Constitutional: Alert and oriented. Well appearing and in no acute distress. Eyes: Conjunctivae are normal. PERRL. EOMI. Head: Atraumatic. ENT:  Ears:      Nose: No congestion/rhinnorhea.      Mouth/Throat: Mucous membranes are moist.  Neck: No stridor.  Cardiovascular: Normal rate, regular rhythm.  Good peripheral circulation.  Symmetric pedal pulses bilaterally.  Cap refill less than 2 seconds. Respiratory: Normal respiratory effort without tachypnea or retractions. Lungs CTAB. Good air entry to the bases with no decreased or absent breath sounds. Musculoskeletal: Full range of motion to all extremities. No gross deformities appreciated.  Full range of motion of foot and ankle.  Mild tenderness to palpation to dorsal mid foot. Neurologic:  Normal speech and language. No gross focal neurologic deficits are appreciated.  Skin:  Skin is warm, dry and intact. No rash noted. Psychiatric: Mood and affect are normal. Speech and behavior are  normal. Patient exhibits appropriate insight and judgement.   ____________________________________________   LABS (all labs ordered are listed, but only abnormal results are displayed)  Labs Reviewed - No data to display ____________________________________________  EKG   ____________________________________________  RADIOLOGY   No results found.  ____________________________________________    PROCEDURES  Procedure(s) performed:    Procedures    Medications  ketorolac (TORADOL) 30 MG/ML injection 30 mg (30 mg Intramuscular Given 02/14/19 1124)     ____________________________________________   INITIAL IMPRESSION / ASSESSMENT AND PLAN / ED COURSE  Pertinent labs & imaging results that were available during my care of the patient were reviewed by me and considered in my medical decision making (see chart for details).  Review of the [place] CSRS was performed in accordance of the [facility] prior to dispensing any controlled drugs.     Patient presented to emergency department for evaluation of chronic intermittent foot pain.  Vital signs and exam are reassuring.  Patient had an x-ray completed last fall and is in agreement that x-ray will unlikely show any additional information since she has not had any trauma.  She was given IM Toradol for pain.  Postop shoe and Ace wrap were placed.  Patient will be discharged home with prescriptions for Toradol and a short course of Percocet. Patient is to follow up with podiatry as directed.  Referral was given to Dr. Alberteen Spindleline.  Patient is given ED precautions to return to the ED for any worsening or new symptoms.     ____________________________________________  FINAL CLINICAL IMPRESSION(S) / ED DIAGNOSES  Final diagnoses:  Left foot pain      NEW MEDICATIONS STARTED DURING THIS VISIT:  ED Discharge Orders         Ordered    traMADol (ULTRAM) 50 MG tablet  Every 6 hours PRN     02/14/19 1149    ketorolac (TORADOL) 10 MG  tablet  Every 6 hours PRN     02/14/19 1149    lidocaine (LIDODERM) 5 %  Every 24 hours     02/14/19 1149              This chart was dictated using voice recognition software/Dragon. Despite best efforts to proofread, errors can occur which can change the meaning. Any change was purely unintentional.    Enid DerryWagner, Latanya Hemmer, PA-C 02/14/19 1513    Emily FilbertWilliams, Jonathan E, MD 02/19/19 [phone]

## 2019-04-21 ENCOUNTER — Encounter: Payer: Self-pay | Admitting: Emergency Medicine

## 2019-04-21 ENCOUNTER — Emergency Department: Payer: No Typology Code available for payment source

## 2019-04-21 ENCOUNTER — Emergency Department
Admission: EM | Admit: 2019-04-21 | Discharge: 2019-04-21 | Disposition: A | Discharge status: Home / Self Care | Payer: No Typology Code available for payment source | Attending: Emergency Medicine | Admitting: Emergency Medicine

## 2019-04-21 ENCOUNTER — Other Ambulatory Visit: Payer: Self-pay

## 2019-04-21 DIAGNOSIS — Y998 Other external cause status: Secondary | ICD-10-CM | POA: Diagnosis not present

## 2019-04-21 DIAGNOSIS — M549 Dorsalgia, unspecified: Secondary | ICD-10-CM | POA: Insufficient documentation

## 2019-04-21 DIAGNOSIS — F17200 Nicotine dependence, unspecified, uncomplicated: Secondary | ICD-10-CM | POA: Diagnosis not present

## 2019-04-21 DIAGNOSIS — M542 Cervicalgia: Secondary | ICD-10-CM | POA: Diagnosis present

## 2019-04-21 DIAGNOSIS — Y9389 Activity, other specified: Secondary | ICD-10-CM | POA: Insufficient documentation

## 2019-04-21 DIAGNOSIS — Y92410 Unspecified street and highway as the place of occurrence of the external cause: Secondary | ICD-10-CM | POA: Diagnosis not present

## 2019-04-21 DIAGNOSIS — J45909 Unspecified asthma, uncomplicated: Secondary | ICD-10-CM | POA: Insufficient documentation

## 2019-04-21 MED ORDER — KETOROLAC TROMETHAMINE 30 MG/ML IJ SOLN
30.0000 mg | Freq: Once | INTRAMUSCULAR | Status: AC
  Administered 2019-04-21: 30 mg via INTRAMUSCULAR
  Filled 2019-04-21: qty 1

## 2019-04-21 MED ORDER — METHOCARBAMOL 500 MG PO TABS: 500.0000 mg | ORAL_TABLET | Freq: Three times a day (TID) | ORAL | 0 refills | Status: AC | PRN

## 2019-04-21 MED ORDER — MELOXICAM 15 MG PO TABS: 15.0000 mg | ORAL_TABLET | Freq: Every day | ORAL | 1 refills | Status: AC
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
## 2019-04-21 NOTE — ED Notes (Signed)
AAOx3.  Skin warm and dry.  NAD 

## 2019-09-05 ENCOUNTER — Other Ambulatory Visit: Payer: Self-pay

## 2019-09-05 ENCOUNTER — Emergency Department: Payer: Self-pay

## 2019-09-05 ENCOUNTER — Emergency Department
Admission: EM | Admit: 2019-09-05 | Discharge: 2019-09-05 | Disposition: A | Discharge status: Home / Self Care | Payer: Self-pay | Attending: Emergency Medicine | Admitting: Emergency Medicine

## 2019-09-05 DIAGNOSIS — F17210 Nicotine dependence, cigarettes, uncomplicated: Secondary | ICD-10-CM | POA: Insufficient documentation

## 2019-09-05 DIAGNOSIS — J069 Acute upper respiratory infection, unspecified: Secondary | ICD-10-CM | POA: Insufficient documentation

## 2019-09-05 DIAGNOSIS — J4520 Mild intermittent asthma, uncomplicated: Secondary | ICD-10-CM | POA: Insufficient documentation

## 2019-09-05 MED ORDER — ALBUTEROL SULFATE HFA 108 (90 BASE) MCG/ACT IN AERS: 2.0000 | INHALATION_SPRAY | Freq: Four times a day (QID) | RESPIRATORY_TRACT | 0 refills | Status: DC | PRN

## 2019-09-05 MED ORDER — HYDROCODONE-ACETAMINOPHEN 5-325 MG PO TABS
1.0000 | ORAL_TABLET | Freq: Once | ORAL | Status: AC
  Administered 2019-09-05: 1 via ORAL
  Filled 2019-09-05: qty 1

## 2019-09-05 MED ORDER — PREDNISONE 10 MG PO TABS: ORAL_TABLET | ORAL | 0 refills | Status: DC

## 2019-09-05 NOTE — Discharge Instructions (Signed)
Follow-up with your primary care provider if any continued problems or concerns.  You may also [facility] at [facility] or any urgent care if you do not have a primary care provider.  Discontinue or decrease smoking as this will aggravate your asthma.  Begin taking the prednisone as directed over the next 6 days.  Also an albuterol inhaler was sent to your pharmacy.  This is 2 puffs every 6 hours as needed for shortness of breath or wheezing.  You may also take Tylenol with this medication if needed for muscle aches, fever, headache.  Increase fluids.  Should you find out that you have been exposed to Covid you may go to the Covid test center at the visitor entrance of the hospital for a free Covid test.  You will remain in your car while the test is being done.

## 2019-09-05 NOTE — ED Provider Notes (Signed)
[facility] Emergency Department Provider Note   ____________________________________________   First MD Initiated Contact with Patient 09/05/19 1042     (approximate)  I have reviewed the triage vital signs and the nursing notes.   HISTORY  Chief Complaint Cough and Generalized Body Aches   HPI Theresa Drake is a 38 y.o. female presents to the ED with complaint of cough, wheezing, body aches without fever or chills.  Patient has a history of asthma and currently does not have an inhaler.  She also continues to smoke daily.  She denies any change in taste, smell, or exposure to known Covid.  Patient also denies any GI disturbances.  There is minimal cough.  She currently rates her body aches as an 8 out of 10.      Past Medical History:  Diagnosis Date  . Asthma     There are no active problems to display for this patient.   Past Surgical History:  Procedure Laterality Date  . TUBAL LIGATION      Prior to Admission medications   Medication Sig Start Date End Date Taking? Authorizing Provider  albuterol (VENTOLIN HFA) 108 (90 Base) MCG/ACT inhaler Inhale 2 puffs into the lungs every 6 (six) hours as needed for wheezing or shortness of breath. 09/05/19   Johnn Hai, PA-C  predniSONE (DELTASONE) 10 MG tablet Take 6 tablets  today, on day 2 take 5 tablets, day 3 take 4 tablets, day 4 take 3 tablets, day 5 take  2 tablets and 1 tablet the last day 09/05/19   Johnn Hai, PA-C    Allergies Patient has no known allergies.  No family history on file.  Social History Social History   Tobacco Use  . Smoking status: Current Every Day Smoker  . Smokeless tobacco: Never Used  Substance Use Topics  . Alcohol use: Yes  . Drug use: Never    Review of Systems Constitutional: No fever/chills Eyes: No visual changes. ENT: No sore throat.  Negative for ear pain. Cardiovascular: Denies chest pain. Respiratory: Denies shortness of breath.   Positive for wheezing. Gastrointestinal: No abdominal pain.  No nausea, no vomiting.  No diarrhea.  Musculoskeletal: Positive for diffuse body aches. Skin: Negative for rash. Neurological: Negative for headaches, focal weakness or numbness. ___________________________________________   PHYSICAL EXAM:  VITAL SIGNS: ED Triage Vitals  Enc Vitals Group     BP 09/05/19 0909 (!) 137/97     Pulse Rate 09/05/19 0909 87     Resp 09/05/19 0909 18     Temp 09/05/19 0909 98.3 F (36.8 C)     Temp Source 09/05/19 0909 Oral     SpO2 09/05/19 0909 100 %     Weight 09/05/19 0910 260 lb (117.9 kg)     Height 09/05/19 0910 5\' 6"  (1.676 m)     Head Circumference --      Peak Flow --      Pain Score 09/05/19 0910 8     Pain Loc --      Pain Edu? --      Excl. in [place]? --     Constitutional: Alert and oriented. Well appearing and in no acute distress. Eyes: Conjunctivae are normal.  Head: Atraumatic. Nose: No congestion/rhinnorhea. Neck: No stridor.   Cardiovascular: Normal rate, regular rhythm. Grossly normal heart sounds.  Good peripheral circulation. Respiratory: Normal respiratory effort.  No retractions. Lungs diffuse mild expiratory wheezes.  No accessory muscles used.  Patient is able to  talk in complete sentences without any difficulty. Gastrointestinal: Soft and nontender. No distention.  Musculoskeletal: Moves upper and lower extremities with any difficulty.  Normal gait was noted. Neurologic:  Normal speech and language. No gross focal neurologic deficits are appreciated. No gait instability. Skin:  Skin is warm, dry and intact. No rash noted. Psychiatric: Mood and affect are normal. Speech and behavior are normal.  ____________________________________________   LABS (all labs ordered are listed, but only abnormal results are displayed)  Labs Reviewed - No data to display RADIOLOGY   Official radiology report(s): Dg Chest Portable 1 View  Result Date: 09/05/2019 CLINICAL  DATA:  Cough EXAM: PORTABLE CHEST 1 VIEW COMPARISON:  December 29, 2018 FINDINGS: No edema or consolidation. Heart is upper normal in size with pulmonary vascularity normal. No adenopathy. No bone lesions. IMPRESSION: No edema or consolidation. Heart upper normal in size. No evident adenopathy. Electronically Signed   By: Bretta Bang III M.D.   On: 09/05/2019 11:10    ____________________________________________   PROCEDURES  Procedure(s) performed (including Critical Care):  Procedures  ____________________________________________   INITIAL IMPRESSION / ASSESSMENT AND PLAN / ED COURSE  As part of my medical decision making, I reviewed the following data within the electronic MEDICAL RECORD NUMBER Notes from prior ED visits and [place] Controlled Substance Database  38 year old female presents to the ED with complaint of asthma.  Patient states that she does not have an inhaler.  She began having symptoms last evening along with some body aches.  She denies any fever, chills, nausea or vomiting.  There is been no known exposure to Covid.  She continues to eat and drink as normal.  Mild expiratory wheezes were noted during exam.  Patient chest x-ray was reassuring and patient was made aware.  She was given a prescription for albuterol inhaler along with prednisone 6-day taper.  Patient is also encouraged to increase fluids, decrease or discontinue smoking and Tylenol as needed for discomfort.  She was given a note to remain out of work for the next 2 days.  ____________________________________________   FINAL CLINICAL IMPRESSION(S) / ED DIAGNOSES  Final diagnoses:  Viral URI with cough  Mild intermittent asthma without complication  Cigarette smoker     ED Discharge Orders         Ordered    albuterol (VENTOLIN HFA) 108 (90 Base) MCG/ACT inhaler  Every 6 hours PRN     09/05/19 1131    predniSONE (DELTASONE) 10 MG tablet     09/05/19 1131           Note:  This document was  prepared using Dragon voice recognition software and may include unintentional dictation errors.    Tommi Rumps, PA-C 09/05/19 1145    Dionne Bucy, MD 09/05/19 1218

## 2019-09-05 NOTE — ED Notes (Signed)
Radiology to bedside. 

## 2019-09-05 NOTE — ED Triage Notes (Signed)
Pt reports that she began last night having cough, wheezing, body aches. Pt denies fever.

## 2019-10-23 ENCOUNTER — Ambulatory Visit: Payer: Self-pay

## 2019-10-29 ENCOUNTER — Telehealth: Payer: Self-pay

## 2019-10-29 NOTE — Telephone Encounter (Signed)
TC with patient. Discussed plasma center results and need for appt.  Patient states plasma center did 2 tests and they were positive, and requests that partner has appt for testing and treatment also. Appts made back to back for patient and partner.   Specimen collected 10/15/2019 at [facility]. Positive NAAT. Report faxed to RN 10/26/2019 Richmond Campbell, RN

## 2019-10-31 ENCOUNTER — Encounter: Payer: Self-pay | Admitting: Family Medicine

## 2019-10-31 ENCOUNTER — Ambulatory Visit: Payer: Self-pay

## 2019-10-31 ENCOUNTER — Other Ambulatory Visit: Payer: Self-pay

## 2019-10-31 ENCOUNTER — Ambulatory Visit: Payer: Self-pay | Admitting: Family Medicine

## 2019-10-31 DIAGNOSIS — J40 Bronchitis, not specified as acute or chronic: Secondary | ICD-10-CM | POA: Insufficient documentation

## 2019-10-31 DIAGNOSIS — A530 Latent syphilis, unspecified as early or late: Secondary | ICD-10-CM

## 2019-10-31 MED ORDER — PENICILLIN G BENZATHINE 2400000 UNIT/4ML IM SUSP
2.4000 10*6.[IU] | Freq: Once | INTRAMUSCULAR | Status: AC
  Administered 2019-10-31: 10:00:00 2400000 [IU] via INTRAMUSCULAR

## 2019-10-31 NOTE — Progress Notes (Signed)
In for Tx per plasma Center-+RPR; desires self collection of cultures Sharlette Dense, RN

## 2019-10-31 NOTE — Progress Notes (Addendum)
  [facility] STI clinic/screening visit  Subjective:  Theresa Drake is a 39 y.o. female being seen today for  Chief Complaint  Patient presents with  . SEXUALLY TRANSMITTED DISEASE     The patient reports they do not have symptoms. Patient reports that they do not desire a pregnancy in the next year. They reported they are not interested in discussing contraception today.   Patient has the following medical conditions:  There are no problems to display for this patient.   HPI  Pt reports she was told "Syphilis test and confirmatory test" were both positive for syphilis at plasma center. She does not bring copies of results today. Denies present or hx of lesions, rashes, flu like illness, changes in vision, HA. Has had 1 partner in past year. Does not use condoms, has BTL for birth control.   She declines all other flowsheet questions.    No components found for: HCV  The following portions of the patient's history were reviewed and updated as appropriate: allergies, current medications, past medical history, past social history, past surgical history and problem list.  Objective:  There were no vitals filed for this visit.  Physical Exam   Pt declines a physical exam today.    Assessment and Plan:  Theresa Drake is a 39 y.o. female presenting [facility] for STI screening   1. 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 She requests only treatment and syphilis testing today, declines all other STI testing, declines exam, declines extensive questions. She is frustrated, only wants treatment, ready to go.  -Will treat w/Penicillin G 2.4 million IM x1, draw syphilis labs today and depending on results assess if this should be treated as late latent with 2 more injections.  -If positive RPR will need titers at 6 and 12 months, she was informed of this.  -Pt advised no sex x2  wks. Encouraged condoms with all sex. - Syphilis Serology, [facility]    Ann Held, PA-C

## 2019-11-06 ENCOUNTER — Encounter: Payer: Self-pay | Admitting: Family Medicine

## 2019-11-06 ENCOUNTER — Telehealth: Payer: Self-pay | Admitting: Family Medicine

## 2019-11-06 DIAGNOSIS — A528 Late syphilis, latent: Secondary | ICD-10-CM | POA: Insufficient documentation

## 2019-11-06 NOTE — Telephone Encounter (Signed)
Pt's syphilis RPR returned with a titer of 1:1, TP confirmation positive.   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   She received first injection 10/31/19. I have been unable to contact pt by phone to have her come for the last 2 injections. [facility] has been notified to follow up with the patient.

## 2019-11-13 ENCOUNTER — Telehealth: Payer: Self-pay

## 2019-11-13 DIAGNOSIS — A530 Latent syphilis, unspecified as early or late: Secondary | ICD-10-CM

## 2019-11-13 NOTE — Telephone Encounter (Signed)
TC with Tiara at [facility].  States she has mailed a letter requesting contact with the patient. While RN was on the phone with Tiara she attempted calling patient's mom and left a message. Richmond Campbell, RN

## 2019-11-20 ENCOUNTER — Ambulatory Visit: Payer: Self-pay

## 2019-12-28 NOTE — Telephone Encounter (Signed)
Mychart message sent to patient. Richmond Campbell, RN

## 2020-04-29 ENCOUNTER — Telehealth: Payer: Self-pay | Admitting: General Practice

## 2020-04-29 NOTE — Telephone Encounter (Signed)
Individual has been contacted 3+ times regarding ED referral and has been given information regarding the clinic. No further attempts to contact individual will be made. 

## 2020-05-21 ENCOUNTER — Encounter: Payer: Self-pay | Admitting: Emergency Medicine

## 2020-05-21 ENCOUNTER — Emergency Department
Admission: EM | Admit: 2020-05-21 | Discharge: 2020-05-21 | Disposition: A | Discharge status: Home / Self Care | Payer: Self-pay | Attending: Student in an Organized Health Care Education/Training Program | Admitting: Student in an Organized Health Care Education/Training Program

## 2020-05-21 ENCOUNTER — Other Ambulatory Visit: Payer: Self-pay

## 2020-05-21 DIAGNOSIS — J209 Acute bronchitis, unspecified: Secondary | ICD-10-CM | POA: Insufficient documentation

## 2020-05-21 DIAGNOSIS — F17210 Nicotine dependence, cigarettes, uncomplicated: Secondary | ICD-10-CM | POA: Insufficient documentation

## 2020-05-21 DIAGNOSIS — J45909 Unspecified asthma, uncomplicated: Secondary | ICD-10-CM | POA: Insufficient documentation

## 2020-05-21 DIAGNOSIS — Z7951 Long term (current) use of inhaled steroids: Secondary | ICD-10-CM | POA: Insufficient documentation

## 2020-05-21 MED ORDER — BENZONATATE 100 MG PO CAPS: 100.0000 mg | ORAL_CAPSULE | Freq: Three times a day (TID) | ORAL | 0 refills | Status: DC | PRN

## 2020-05-21 MED ORDER — ALBUTEROL SULFATE HFA 108 (90 BASE) MCG/ACT IN AERS: 2.0000 | INHALATION_SPRAY | Freq: Four times a day (QID) | RESPIRATORY_TRACT | 0 refills | Status: DC | PRN

## 2020-05-21 NOTE — ED Provider Notes (Signed)
[facility] Emergency Department Provider Note   ____________________________________________   First MD Initiated Contact with Patient 05/21/20 1345     (approximate)  I have reviewed the triage vital signs and the nursing notes.   HISTORY  Chief Complaint Cough    HPI Theresa Drake is a 39 y.o. female presents to the ED with complaint of cough.  Patient states her cough is worse at night.  She states that this is her usual bronchitic cough.  Presently she is smoking approximately 4 cigarettes/day but has smoked more in the past.  She is currently trying to discontinue smoking.  She denies any fever, chills, nausea or vomiting.  There is been no exposure to Covid.        Past Medical History:  Diagnosis Date  . Asthma     Patient Active Problem List   Diagnosis Date Noted  . 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 Bronchitis 10/31/2019    Past Surgical History:  Procedure Laterality Date  . TUBAL LIGATION      Prior to Admission medications   Medication Sig Start Date End Date Taking? Authorizing Provider  albuterol (VENTOLIN HFA) 108 (90 Base) MCG/ACT inhaler Inhale 2 puffs into the lungs every 6 (six) hours as needed for wheezing or shortness of breath. 05/21/20   Tommi Rumps, PA-C  benzonatate (TESSALON PERLES) 100 MG capsule Take 1 capsule (100 mg total) by mouth 3 (three) times daily as needed for cough. 05/21/20 05/21/21  Tommi Rumps, PA-C    Allergies Patient has no known allergies.  No family history on file.  Social History Social History   Tobacco Use  . Smoking status: Current Every Day Smoker  . Smokeless tobacco: Never Used  Substance Use Topics  . Alcohol use: Yes  . Drug use: Never    Review of Systems Constitutional: No fever/chills Eyes: No visual changes. ENT: No sore throat. Cardiovascular: Denies chest pain. Respiratory: Denies shortness of breath.  Positive nonproductive cough. Gastrointestinal: No  abdominal pain.  No nausea, no vomiting.  No diarrhea.   Musculoskeletal: Negative for muscle aches. Skin: Negative for rash. Neurological: Negative for headaches, focal weakness or numbness. ____________________________________________   PHYSICAL EXAM:  VITAL SIGNS: ED Triage Vitals  Enc Vitals Group     BP 05/21/20 1255 134/78     Pulse Rate 05/21/20 1255 80     Resp 05/21/20 1255 18     Temp 05/21/20 1255 98 F (36.7 C)     Temp Source 05/21/20 1255 Oral     SpO2 05/21/20 1255 98 %     Weight 05/21/20 1252 257 lb 15 oz (117 kg)     Height 05/21/20 1252 5\' 6"  (1.676 m)     Head Circumference --      Peak Flow --      Pain Score 05/21/20 1252 3     Pain Loc --      Pain Edu? --      Excl. in [place]? --     Constitutional: Alert and oriented. Well appearing and in no acute distress. Eyes: Conjunctivae are normal.  Head: Atraumatic. Nose: No congestion/rhinnorhea. Neck: No stridor.   Cardiovascular: Normal rate, regular rhythm. Grossly normal heart sounds.  Good peripheral circulation. Respiratory: Normal respiratory effort.  No retractions. Lungs occasional expiratory wheeze with bronchitic cough.  Patient is able to talk in complete sentences without any difficulty. Gastrointestinal: Soft and nontender. No distention. Musculoskeletal: Moves upper and lower extremities without any  difficulty.  Normal gait was noted. Neurologic:  Normal speech and language. No gross focal neurologic deficits are appreciated. No gait instability. Skin:  Skin is warm, dry and intact. No rash noted. Psychiatric: Mood and affect are normal. Speech and behavior are normal.  ____________________________________________   LABS (all labs ordered are listed, but only abnormal results are displayed)  Labs Reviewed - No data to display ____________________________________________  PROCEDURES  Procedure(s) performed (including Critical  Care):  Procedures   ____________________________________________   INITIAL IMPRESSION / ASSESSMENT AND PLAN / ED COURSE  As part of my medical decision making, I reviewed the following data within the electronic MEDICAL RECORD NUMBER Notes from prior ED visits and [place] Controlled Substance Database  Theresa Drake was evaluated in Emergency Department on 05/21/2020 for the symptoms described in the history of present illness. She was evaluated in the context of the global COVID-19 pandemic, which necessitated consideration that the patient might be at risk for infection with the SARS-CoV-2 virus that causes COVID-19. Institutional protocols and algorithms that pertain to the evaluation of patients at risk for COVID-19 are in a state of rapid change based on information released by regulatory bodies including the CDC and federal and state organizations. These policies and algorithms were followed during the patient's care in the ED.  39 year old female presents to the ED with complaint of bronchitis.  Patient states that she began feeling her bronchitis yesterday.  Patient has a history of bronchitis and asthma.  She currently is out of her albuterol.  She denies any symptoms of Covid or Covid exposure.  A prescription for albuterol and Tessalon Perles was sent to the pharmacy.  Patient is encouraged to discontinue smoking completely.  She will follow-up with her PCP if any continued problems.  ____________________________________________   FINAL CLINICAL IMPRESSION(S) / ED DIAGNOSES  Final diagnoses:  Acute bronchitis, unspecified organism     ED Discharge Orders         Ordered    albuterol (VENTOLIN HFA) 108 (90 Base) MCG/ACT inhaler  Every 6 hours PRN     Discontinue  Reprint     05/21/20 1424    benzonatate (TESSALON PERLES) 100 MG capsule  3 times daily PRN     Discontinue  Reprint     05/21/20 1424           Note:  This document was prepared using Dragon voice recognition software  and may include unintentional dictation errors.    Tommi Rumps, PA-C 05/21/20 1449    Willy Eddy, MD 05/21/20 [phone]

## 2020-05-21 NOTE — ED Triage Notes (Signed)
Presents with cough  States she has a hx of bronchitis   And feels the same  Cough is worse at night

## 2020-05-21 NOTE — Discharge Instructions (Signed)
Follow-up with your primary care provider if any continued problems.  Begin using your albuterol inhaler and also using Tessalon every 8 hours as needed for cough.  Continue working on not smoking as this will make a big difference and the frequency of your bronchitis.

## 2020-06-07 ENCOUNTER — Emergency Department: Payer: Self-pay

## 2020-06-07 ENCOUNTER — Emergency Department
Admission: EM | Admit: 2020-06-07 | Discharge: 2020-06-07 | Disposition: A | Discharge status: Home / Self Care | Payer: Self-pay | Attending: Emergency Medicine | Admitting: Emergency Medicine

## 2020-06-07 ENCOUNTER — Other Ambulatory Visit: Payer: Self-pay

## 2020-06-07 DIAGNOSIS — F17210 Nicotine dependence, cigarettes, uncomplicated: Secondary | ICD-10-CM | POA: Insufficient documentation

## 2020-06-07 DIAGNOSIS — R1011 Right upper quadrant pain: Secondary | ICD-10-CM | POA: Insufficient documentation

## 2020-06-07 DIAGNOSIS — R109 Unspecified abdominal pain: Secondary | ICD-10-CM

## 2020-06-07 DIAGNOSIS — Z79899 Other long term (current) drug therapy: Secondary | ICD-10-CM | POA: Insufficient documentation

## 2020-06-07 DIAGNOSIS — J45909 Unspecified asthma, uncomplicated: Secondary | ICD-10-CM | POA: Insufficient documentation

## 2020-06-07 LAB — CBC
HCT: 34 % — ABNORMAL LOW (ref 36.0–46.0)
Hemoglobin: 11.2 g/dL — ABNORMAL LOW (ref 12.0–15.0)
MCH: 25.9 pg — ABNORMAL LOW (ref 26.0–34.0)
MCHC: 32.9 g/dL (ref 30.0–36.0)
MCV: 78.5 fL — ABNORMAL LOW (ref 80.0–100.0)
Platelets: 352 10*3/uL (ref 150–400)
RBC: 4.33 MIL/uL (ref 3.87–5.11)
RDW: 17.4 % — ABNORMAL HIGH (ref 11.5–15.5)
WBC: 7.9 10*3/uL (ref 4.0–10.5)
nRBC: 0 % (ref 0.0–0.2)

## 2020-06-07 LAB — COMPREHENSIVE METABOLIC PANEL
ALT: 20 U/L (ref 0–44)
AST: 18 U/L (ref 15–41)
Albumin: 4.1 g/dL (ref 3.5–5.0)
Alkaline Phosphatase: 69 U/L (ref 38–126)
Anion gap: 11 (ref 5–15)
BUN: 12 mg/dL (ref 6–20)
CO2: 27 mmol/L (ref 22–32)
Calcium: 9.2 mg/dL (ref 8.9–10.3)
Chloride: 99 mmol/L (ref 98–111)
Creatinine, Ser: 0.89 mg/dL (ref 0.44–1.00)
GFR calc Af Amer: >60 mL/min (ref >60)
GFR calc non Af Amer: >60 mL/min (ref >60)
Glucose, Bld: 92 mg/dL (ref 70–99)
Potassium: 3.6 mmol/L (ref 3.5–5.1)
Sodium: 137 mmol/L (ref 135–145)
Total Bilirubin: 0.5 mg/dL (ref 0.3–1.2)
Total Protein: 8.1 g/dL (ref 6.5–8.1)

## 2020-06-07 LAB — URINALYSIS, COMPLETE (UACMP) WITH MICROSCOPIC
Bacteria, UA: NONE SEEN
Bilirubin Urine: NEGATIVE
Glucose, UA: NEGATIVE mg/dL
Ketones, ur: NEGATIVE mg/dL
Leukocytes,Ua: NEGATIVE
Nitrite: NEGATIVE
Protein, ur: NEGATIVE mg/dL
Specific Gravity, Urine: 1.027 (ref 1.005–1.030)
pH: 5 (ref 5.0–8.0)

## 2020-06-07 LAB — LIPASE, BLOOD: Lipase: 19 U/L (ref 11–51)

## 2020-06-07 MED ORDER — DICYCLOMINE HCL 10 MG PO CAPS
10.0000 mg | ORAL_CAPSULE | Freq: Once | ORAL | Status: AC
  Administered 2020-06-07: 10 mg via ORAL
  Filled 2020-06-07: qty 1

## 2020-06-07 MED ORDER — DICYCLOMINE HCL 10 MG PO CAPS: 10.0000 mg | ORAL_CAPSULE | Freq: Four times a day (QID) | ORAL | 0 refills | Status: DC

## 2020-06-07 NOTE — ED Provider Notes (Signed)
[facility] Emergency Department Provider Note  ____________________________________________   First MD Initiated Contact with Patient 06/07/20 1515     (approximate)  I have reviewed the triage vital signs and the nursing notes.   HISTORY  Chief Complaint Abdominal Pain    HPI Theresa Drake is a 39 y.o. female presents emergency department complaining of midepigastric pain.  She is complaining of right upper quadrant pain.  She is not complaining of lower quadrant pain.  Unsure of why she told triage lower quadrant bilaterally.  She states it will cramp up and stick out like it does when you are pregnant.  She denies any fever or chills.  Denies any UTI symptoms.  No vaginal discharge.    Past Medical History:  Diagnosis Date  . Asthma     Patient Active Problem List   Diagnosis Date Noted  . 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 Bronchitis 10/31/2019    Past Surgical History:  Procedure Laterality Date  . TUBAL LIGATION      Prior to Admission medications   Medication Sig Start Date End Date Taking? Authorizing Provider  albuterol (VENTOLIN HFA) 108 (90 Base) MCG/ACT inhaler Inhale 2 puffs into the lungs every 6 (six) hours as needed for wheezing or shortness of breath. 05/21/20   Tommi Rumps, PA-C  benzonatate (TESSALON PERLES) 100 MG capsule Take 1 capsule (100 mg total) by mouth 3 (three) times daily as needed for cough. 05/21/20 05/21/21  Tommi Rumps, PA-C  dicyclomine (BENTYL) 10 MG capsule Take 1 capsule (10 mg total) by mouth 4 (four) times daily for 14 days. 06/07/20 06/21/20  Faythe Ghee, PA-C    Allergies Patient has no known allergies.  History reviewed. No pertinent family history.  Social History Social History   Tobacco Use  . Smoking status: Current Every Day Smoker  . Smokeless tobacco: Never Used  Substance Use Topics  . Alcohol use: Yes  . Drug use: Never    Review of Systems  Constitutional: No  fever/chills Eyes: No visual changes. ENT: No sore throat. Respiratory: Denies cough Cardiovascular: Denies chest pain Gastrointestinal: Positive abdominal pain Genitourinary: Negative for dysuria. Musculoskeletal: Negative for back pain. Skin: Negative for rash. Psychiatric: no mood changes,     ____________________________________________   PHYSICAL EXAM:  VITAL SIGNS: ED Triage Vitals  Enc Vitals Group     BP 06/07/20 1352 136/85     Pulse Rate 06/07/20 1352 86     Resp 06/07/20 1352 18     Temp 06/07/20 1352 98.9 F (37.2 C)     Temp Source 06/07/20 1352 Oral     SpO2 06/07/20 1352 100 %     Weight 06/07/20 1354 293 lb 3.4 oz (133 kg)     Height 06/07/20 1354 5\' 6"  (1.676 m)     Head Circumference --      Peak Flow --      Pain Score 06/07/20 1352 10     Pain Loc --      Pain Edu? --      Excl. in [place]? --     Constitutional: Alert and oriented. Well appearing and in no acute distress. Eyes: Conjunctivae are normal.  Head: Atraumatic. Nose: No congestion/rhinnorhea. Mouth/Throat: Mucous membranes are moist.   Neck:  supple no lymphadenopathy noted Cardiovascular: Normal rate, regular rhythm. Heart sounds are normal Respiratory: Normal respiratory effort.  No retractions, lungs c t a  Abd: soft tender in the right upper quadrant, positive  Murphy sign on physical exam, negative McBurney's point tenderness, bs normal all 4 quad GU: deferred Musculoskeletal: FROM all extremities, warm and well perfused Neurologic:  Normal speech and language.  Skin:  Skin is warm, dry and intact. No rash noted. Psychiatric: Mood and affect are normal. Speech and behavior are normal.  ____________________________________________   LABS (all labs ordered are listed, but only abnormal results are displayed)  Labs Reviewed  CBC - Abnormal; Notable for the following components:      Result Value   Hemoglobin 11.2 (*)    HCT 34.0 (*)    MCV 78.5 (*)    MCH 25.9 (*)    RDW 17.4  (*)    All other components within normal limits  LIPASE, BLOOD  COMPREHENSIVE METABOLIC PANEL  URINALYSIS, COMPLETE (UACMP) WITH MICROSCOPIC  POC URINE PREG, ED   ____________________________________________   ____________________________________________  RADIOLOGY  Ultrasound right upper quadrant is negative for acute cholecystitis  ____________________________________________   PROCEDURES  Procedure(s) performed: No  Procedures    ____________________________________________   INITIAL IMPRESSION / ASSESSMENT AND PLAN / ED COURSE  Pertinent labs & imaging results that were available during my care of the patient were reviewed by me and considered in my medical decision making (see chart for details).   Patient is a 39 year old female presents emergency department with right upper quadrant pains.  See HPI  Physical exam shows patient to appear well.  Right upper quadrant is tender to palpation.  Positive Murphy sign on physical exam.  DDx: Epigastric pain, acute cholecystitis, IBS  CBC showing slightly decreased H&H but not severe, comprehensive metabolic panel is normal, lipase normal,  Ultrasound right upper quadrant ordered  Ultrasound does not show any gallstones or acute cholecystitis.  I did explain findings to the patient.  She is ready to go home.  I told her I would still like for her to give [place] urine I can call her back with the urine results later today.  I do feel this is more of an IBS type problem this is been ongoing for over 2 months.  We will start her on Bentyl for abdominal cramping.  If she is not improving with this medication she can follow-up with a GI specialist.  She states she understands.  She is asking for a work note was provided with 1 for today and tomorrow.  She is discharged in stable condition.     Theresa Drake was evaluated in Emergency Department on 06/07/2020 for the symptoms described in the history of present illness. She was  evaluated in the context of the global COVID-19 pandemic, which necessitated consideration that the patient might be at risk for infection with the SARS-CoV-2 virus that causes COVID-19. Institutional protocols and algorithms that pertain to the evaluation of patients at risk for COVID-19 are in a state of rapid change based on information released by regulatory bodies including the CDC and federal and state organizations. These policies and algorithms were followed during the patient's care in the ED.    As part of my medical decision making, I reviewed the following data within the electronic MEDICAL RECORD NUMBER Nursing notes reviewed and incorporated, Labs reviewed , Old chart reviewed, Radiograph reviewed , Notes from prior ED visits and [place] Controlled Substance Database  ____________________________________________   FINAL CLINICAL IMPRESSION(S) / ED DIAGNOSES  Final diagnoses:  RUQ pain  Abdominal cramping      NEW MEDICATIONS STARTED DURING THIS VISIT:  New Prescriptions   DICYCLOMINE (BENTYL) 10  MG CAPSULE    Take 1 capsule (10 mg total) by mouth 4 (four) times daily for 14 days.     Note:  This document was prepared using Dragon voice recognition software and may include unintentional dictation errors.    Faythe Ghee, PA-C 06/07/20 1638    Sharman Cheek, MD 06/07/20 [phone]

## 2020-06-07 NOTE — Discharge Instructions (Signed)
Follow-up with your regular doctor if not improving in 2 to 3 days.  Return emergency department worsening.  If the Bentyl is not improving the abdominal cramping should follow-up with a GI specialist.  Their phone number is attached to your paperwork.

## 2020-06-07 NOTE — ED Triage Notes (Signed)
Pt states that she has been having lower abdominal pain for 2 months that has gotten worse last night- pt states it starts in the right lower quadrant but when she tries to "strighten up" the pain moves

## 2020-06-07 NOTE — ED Notes (Signed)
As per provider. Pt was discharged several hours ago. This RN is taking pt off board, but this RN did not have any interaction with pt.

## 2020-06-07 NOTE — ED Notes (Signed)
Pt ambulatory to restroom, no distress

## 2020-06-07 NOTE — ED Notes (Signed)
Documented time in error supposed to be 1910

## 2020-07-29 ENCOUNTER — Emergency Department
Admission: EM | Admit: 2020-07-29 | Discharge: 2020-07-29 | Disposition: A | Discharge status: Home / Self Care | Payer: Self-pay | Attending: Emergency Medicine | Admitting: Emergency Medicine

## 2020-07-29 ENCOUNTER — Other Ambulatory Visit: Payer: Self-pay

## 2020-07-29 ENCOUNTER — Emergency Department: Payer: Self-pay

## 2020-07-29 DIAGNOSIS — M79601 Pain in right arm: Secondary | ICD-10-CM | POA: Insufficient documentation

## 2020-07-29 DIAGNOSIS — J45909 Unspecified asthma, uncomplicated: Secondary | ICD-10-CM | POA: Insufficient documentation

## 2020-07-29 DIAGNOSIS — M7918 Myalgia, other site: Secondary | ICD-10-CM

## 2020-07-29 DIAGNOSIS — F17200 Nicotine dependence, unspecified, uncomplicated: Secondary | ICD-10-CM | POA: Insufficient documentation

## 2020-07-29 DIAGNOSIS — M25512 Pain in left shoulder: Secondary | ICD-10-CM | POA: Insufficient documentation

## 2020-07-29 LAB — CBC
HCT: 35.6 % — ABNORMAL LOW (ref 36.0–46.0)
Hemoglobin: 11.5 g/dL — ABNORMAL LOW (ref 12.0–15.0)
MCH: 25.6 pg — ABNORMAL LOW (ref 26.0–34.0)
MCHC: 32.3 g/dL (ref 30.0–36.0)
MCV: 79.1 fL — ABNORMAL LOW (ref 80.0–100.0)
Platelets: 348 10*3/uL (ref 150–400)
RBC: 4.5 MIL/uL (ref 3.87–5.11)
RDW: 17.5 % — ABNORMAL HIGH (ref 11.5–15.5)
WBC: 7.2 10*3/uL (ref 4.0–10.5)
nRBC: 0 % (ref 0.0–0.2)

## 2020-07-29 LAB — BASIC METABOLIC PANEL
Anion gap: 10 (ref 5–15)
BUN: 7 mg/dL (ref 6–20)
CO2: 25 mmol/L (ref 22–32)
Calcium: 9 mg/dL (ref 8.9–10.3)
Chloride: 104 mmol/L (ref 98–111)
Creatinine, Ser: 0.82 mg/dL (ref 0.44–1.00)
GFR, Estimated: >60 mL/min (ref >60)
Glucose, Bld: 97 mg/dL (ref 70–99)
Potassium: 3.9 mmol/L (ref 3.5–5.1)
Sodium: 139 mmol/L (ref 135–145)

## 2020-07-29 LAB — TROPONIN I (HIGH SENSITIVITY): Troponin I (High Sensitivity): 4 ng/L (ref <18)

## 2020-07-29 MED ORDER — CYCLOBENZAPRINE HCL 10 MG PO TABS: 10.0000 mg | ORAL_TABLET | Freq: Three times a day (TID) | ORAL | 0 refills | Status: DC | PRN

## 2020-07-29 MED ORDER — MELOXICAM 15 MG PO TABS: 15.0000 mg | ORAL_TABLET | Freq: Every day | ORAL | 2 refills | Status: DC

## 2020-07-29 NOTE — ED Triage Notes (Signed)
Pt here with CP that started a few days ago. Pain radiated to back and right arm. Pt also states that she is SOB at night. Pt NAD in triage.

## 2020-07-29 NOTE — Discharge Instructions (Addendum)
Follow-up with regular dilated time.  He is now 7 days.  Return emergency department worsening.  Take medication as prescribed.  Use a warm wet compress to the upper back.

## 2020-07-29 NOTE — ED Notes (Signed)
See triage note, pt reports left side CP that started this morning, SHOB x 3 days, and right arm pain x 1 week. Denies injury to arm.  Denies N/V.  Pt in NAD, RR even and unlabored.

## 2020-07-29 NOTE — ED Provider Notes (Signed)
[facility] Emergency Department Provider Note  ____________________________________________   First MD Initiated Contact with Patient 07/29/20 1800     (approximate)  I have reviewed the triage vital signs and the nursing notes.   HISTORY  Chief Complaint Chest Pain, Shoulder Pain, and Arm Pain    HPI Theresa Drake is a 39 y.o. female presents emergency department complaining of left-sided upper shoulder pain that radiates across the chest.  States she has also had some shortness of breath for 3 days but states she also has some bronchitis for which she uses an inhaler.  She also has right arm pain.  Patient recently lost her job and has been very stressed about money.  She denies any fever or chills.  Had a negative Covid test 2 to 3 days ago.  Denies any leg pain.   Denies SI/HI   Past Medical History:  Diagnosis Date  . Asthma     Patient Active Problem List   Diagnosis Date Noted  . 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 Bronchitis 10/31/2019    Past Surgical History:  Procedure Laterality Date  . TUBAL LIGATION      Prior to Admission medications   Medication Sig Start Date End Date Taking? Authorizing Provider  albuterol (VENTOLIN HFA) 108 (90 Base) MCG/ACT inhaler Inhale 2 puffs into the lungs every 6 (six) hours as needed for wheezing or shortness of breath. 05/21/20   Tommi Rumps, PA-C  cyclobenzaprine (FLEXERIL) 10 MG tablet Take 1 tablet (10 mg total) by mouth 3 (three) times daily as needed. 07/29/20   Dazja Houchin, Roselyn Bering, PA-C  dicyclomine (BENTYL) 10 MG capsule Take 1 capsule (10 mg total) by mouth 4 (four) times daily for 14 days. 06/07/20 06/21/20  Florean Hoobler, Roselyn Bering, PA-C  meloxicam (MOBIC) 15 MG tablet Take 1 tablet (15 mg total) by mouth daily. 07/29/20 07/29/21  Faythe Ghee, PA-C    Allergies Patient has no known allergies.  No family history on file.  Social History Social History   Tobacco Use  . Smoking status: Current  Every Day Smoker  . Smokeless tobacco: Never Used  Substance Use Topics  . Alcohol use: Yes  . Drug use: Never    Review of Systems  Constitutional: No fever/chills Eyes: No visual changes. ENT: No sore throat. Respiratory: Denies cough Cardiovascular: Denies chest pain Gastrointestinal: Denies abdominal pain Genitourinary: Denies dysuria Musculoskeletal: Complains of muscle pain throughout her back and across her chest Skin: Negative for rash. Psychiatric: no mood changes,     ____________________________________________   PHYSICAL EXAM:  VITAL SIGNS: ED Triage Vitals  Enc Vitals Group     BP 07/29/20 1651 128/75     Pulse Rate 07/29/20 1651 89     Resp 07/29/20 1651 16     Temp 07/29/20 1651 98.2 F (36.8 C)     Temp Source 07/29/20 1651 Oral     SpO2 07/29/20 1651 97 %     Weight 07/29/20 1652 262 lb (118.8 kg)     Height 07/29/20 1652 5\' 6"  (1.676 m)     Head Circumference --      Peak Flow --      Pain Score 07/29/20 1652 7     Pain Loc --      Pain Edu? --      Excl. in GC? --     Constitutional: Alert and oriented. Well appearing and in no acute distress. Eyes: Conjunctivae are normal.  Head: Atraumatic. Nose:  No congestion/rhinnorhea. Mouth/Throat: Mucous membranes are moist.   Neck:  supple no lymphadenopathy noted Cardiovascular: Normal rate, regular rhythm. Heart sounds are normal Respiratory: Normal respiratory effort.  No retractions, lungs c t a  Abd: soft nontender bs normal all 4 quad GU: deferred Musculoskeletal: FROM all extremities, warm and well perfused, trigger point tenderness across the upper back, reproduces pain.  Range of motion with the left shoulder quite reproduces pain Neurologic:  Normal speech and language.  Skin:  Skin is warm, dry and intact. No rash noted. Psychiatric: Mood and affect are normal. Speech and behavior are normal.  ____________________________________________   LABS (all labs ordered are listed, but  only abnormal results are displayed)  Labs Reviewed  CBC - Abnormal; Notable for the following components:      Result Value   Hemoglobin 11.5 (*)    HCT 35.6 (*)    MCV 79.1 (*)    MCH 25.6 (*)    RDW 17.5 (*)    All other components within normal limits  BASIC METABOLIC PANEL  POC URINE PREG, ED  TROPONIN I (HIGH SENSITIVITY)  TROPONIN I (HIGH SENSITIVITY)   ____________________________________________   ____________________________________________  RADIOLOGY  X-rays ordered from triage include a right forearm, chest x-ray, and left shoulder which are all negative  ____________________________________________   PROCEDURES  Procedure(s) performed: EKG shows normal sinus rhythm, see physician read  Procedures    ____________________________________________   INITIAL IMPRESSION / ASSESSMENT AND PLAN / ED COURSE  Pertinent labs & imaging results that were available during my care of the patient were reviewed by me and considered in my medical decision making (see chart for details).   Patient is 39 year old female presents emergency department complaining of muscle pain across her upper back and into her chest.  See HPI.  Physical exam shows patient to appear stable and well.  Areas along the back are very tender to palpation.  DDx: Chest wall pain, acute musculoskeletal pain, fibromyalgia, stress related pain, MI  CBC is basically normal, basic metabolic panel is normal, troponin is negative  EKG shows normal sinus rhythm see physician read  Chest x-ray, left shoulder pain, and right forearm are all negative  Due to the labs being reassuring, with her vitals being normal I do not feel that the patient is having a cardiac event.  I do feel a lot of this muscle spasm and tenderness is related to her recent onset of stress due to financial concerns.  I explained to her that we could place her on a muscle relaxer to relax some of the muscles along with  anti-inflammatory.  She is to return emergency department worsening.  Follow-up with regular doctor if not better in 5 to 7 days.  Resources in the area discussed for help with anxiety/depression.  She was discharged in stable condition.     [name] was evaluated in Emergency Department on 07/29/2020 for the symptoms described in the history of present illness. She was evaluated in the context of the global COVID-19 pandemic, which necessitated consideration that the patient might be at risk for infection with the SARS-CoV-2 virus that causes COVID-19. Institutional protocols and algorithms that pertain to the evaluation of patients at risk for COVID-19 are in a state of rapid change based on information released by regulatory bodies including the CDC and federal and state organizations. These policies and algorithms were followed during the patient's care in the ED.    As part of my medical decision making, I  reviewed the following data within the electronic MEDICAL RECORD NUMBER History obtained from family, Nursing notes reviewed and incorporated, Labs reviewed , EKG interpreted NSR see physician read, Old chart reviewed, Radiograph reviewed , Notes from prior ED visits and [place] Controlled Substance Database  ____________________________________________   FINAL CLINICAL IMPRESSION(S) / ED DIAGNOSES  Final diagnoses:  Musculoskeletal pain      NEW MEDICATIONS STARTED DURING THIS VISIT:  New Prescriptions   CYCLOBENZAPRINE (FLEXERIL) 10 MG TABLET    Take 1 tablet (10 mg total) by mouth 3 (three) times daily as needed.   MELOXICAM (MOBIC) 15 MG TABLET    Take 1 tablet (15 mg total) by mouth daily.     Note:  This document was prepared using Dragon voice recognition software and may include unintentional dictation errors.    Faythe Ghee, PA-C 07/29/20 Redmond Baseman, MD 08/05/20 [phone]

## 2020-08-18 ENCOUNTER — Other Ambulatory Visit: Payer: Self-pay

## 2020-08-18 ENCOUNTER — Emergency Department
Admission: EM | Admit: 2020-08-18 | Discharge: 2020-08-18 | Disposition: A | Discharge status: Home / Self Care | Payer: No Typology Code available for payment source | Attending: Emergency Medicine | Admitting: Emergency Medicine

## 2020-08-18 ENCOUNTER — Emergency Department: Payer: No Typology Code available for payment source

## 2020-08-18 DIAGNOSIS — J45909 Unspecified asthma, uncomplicated: Secondary | ICD-10-CM | POA: Diagnosis not present

## 2020-08-18 DIAGNOSIS — R0789 Other chest pain: Secondary | ICD-10-CM | POA: Insufficient documentation

## 2020-08-18 DIAGNOSIS — F17200 Nicotine dependence, unspecified, uncomplicated: Secondary | ICD-10-CM | POA: Diagnosis not present

## 2020-08-18 DIAGNOSIS — M549 Dorsalgia, unspecified: Secondary | ICD-10-CM | POA: Diagnosis not present

## 2020-08-18 DIAGNOSIS — M542 Cervicalgia: Secondary | ICD-10-CM | POA: Insufficient documentation

## 2020-08-18 MED ORDER — METHOCARBAMOL 500 MG PO TABS: 500.0000 mg | ORAL_TABLET | Freq: Three times a day (TID) | ORAL | 0 refills | Status: AC | PRN

## 2020-08-18 MED ORDER — HYDROCODONE-ACETAMINOPHEN 5-325 MG PO TABS
1.0000 | ORAL_TABLET | Freq: Once | ORAL | Status: AC
  Administered 2020-08-18: 1 via ORAL
  Filled 2020-08-18: qty 1

## 2020-08-18 MED ORDER — IBUPROFEN 800 MG PO TABS
800.0000 mg | ORAL_TABLET | Freq: Once | ORAL | Status: AC
  Administered 2020-08-18: 800 mg via ORAL
  Filled 2020-08-18: qty 1

## 2020-08-18 MED ORDER — MELOXICAM 15 MG PO TABS: 15.0000 mg | ORAL_TABLET | Freq: Every day | ORAL | 1 refills | Status: AC

## 2020-08-18 MED ORDER — ONDANSETRON 4 MG PO TBDP
4.0000 mg | ORAL_TABLET | Freq: Once | ORAL | Status: AC
  Administered 2020-08-18: 4 mg via ORAL
  Filled 2020-08-18: qty 1
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
## 2020-12-12 ENCOUNTER — Other Ambulatory Visit: Payer: Self-pay

## 2020-12-12 ENCOUNTER — Emergency Department: Payer: Self-pay

## 2020-12-12 ENCOUNTER — Emergency Department
Admission: EM | Admit: 2020-12-12 | Discharge: 2020-12-12 | Disposition: A | Discharge status: Home / Self Care | Payer: Self-pay | Attending: Emergency Medicine | Admitting: Emergency Medicine

## 2020-12-12 DIAGNOSIS — Z1152 Encounter for screening for COVID-19: Secondary | ICD-10-CM

## 2020-12-12 DIAGNOSIS — Z20822 Contact with and (suspected) exposure to covid-19: Secondary | ICD-10-CM | POA: Insufficient documentation

## 2020-12-12 DIAGNOSIS — F17200 Nicotine dependence, unspecified, uncomplicated: Secondary | ICD-10-CM | POA: Insufficient documentation

## 2020-12-12 DIAGNOSIS — J45909 Unspecified asthma, uncomplicated: Secondary | ICD-10-CM | POA: Insufficient documentation

## 2020-12-12 DIAGNOSIS — J069 Acute upper respiratory infection, unspecified: Secondary | ICD-10-CM | POA: Insufficient documentation

## 2020-12-12 LAB — RESP PANEL BY RT-PCR (FLU A&B, COVID) ARPGX2
Influenza A by PCR: NEGATIVE
Influenza B by PCR: NEGATIVE
SARS Coronavirus 2 by RT PCR: NEGATIVE

## 2020-12-12 MED ORDER — IPRATROPIUM-ALBUTEROL 0.5-2.5 (3) MG/3ML IN SOLN
3.0000 mL | Freq: Once | RESPIRATORY_TRACT | Status: AC
  Administered 2020-12-12: 3 mL via RESPIRATORY_TRACT
  Filled 2020-12-12: qty 3

## 2020-12-12 MED ORDER — PREDNISONE 20 MG PO TABS
60.0000 mg | ORAL_TABLET | Freq: Once | ORAL | Status: AC
  Administered 2020-12-12: 60 mg via ORAL
  Filled 2020-12-12: qty 3

## 2020-12-12 MED ORDER — IPRATROPIUM-ALBUTEROL 0.5-2.5 (3) MG/3ML IN SOLN: 3.0000 mL | Freq: Four times a day (QID) | RESPIRATORY_TRACT | 0 refills | Status: AC | PRN

## 2020-12-12 MED ORDER — ALBUTEROL SULFATE HFA 108 (90 BASE) MCG/ACT IN AERS: 2.0000 | INHALATION_SPRAY | Freq: Four times a day (QID) | RESPIRATORY_TRACT | 0 refills | Status: AC | PRN

## 2020-12-12 MED ORDER — PREDNISONE 10 MG PO TABS: ORAL_TABLET | ORAL | 0 refills | Status: AC
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
## 2020-12-12 NOTE — Discharge Instructions (Addendum)
Follow-up with your primary care provider if any continued problems or concerns. Begin taking the prednisone tomorrow and taper down as you were given the first dose while in the emergency department. Albuterol inhaler is 2 puffs every 6 hours if needed for shortness of breath, wheezing or coughing. You may also obtain Mucinex over-the-counter as needed for nasal congestion and drink lots of fluids. Tylenol or ibuprofen if needed for body aches.

## 2021-08-23 IMAGING — DX DG CHEST 1V PORT
1 series · 1 of 1 positions shown · non-contrast
Comparison: December 29, 2018

CLINICAL DATA: Cough

EXAM:
PORTABLE CHEST 1 VIEW

[chest ap]
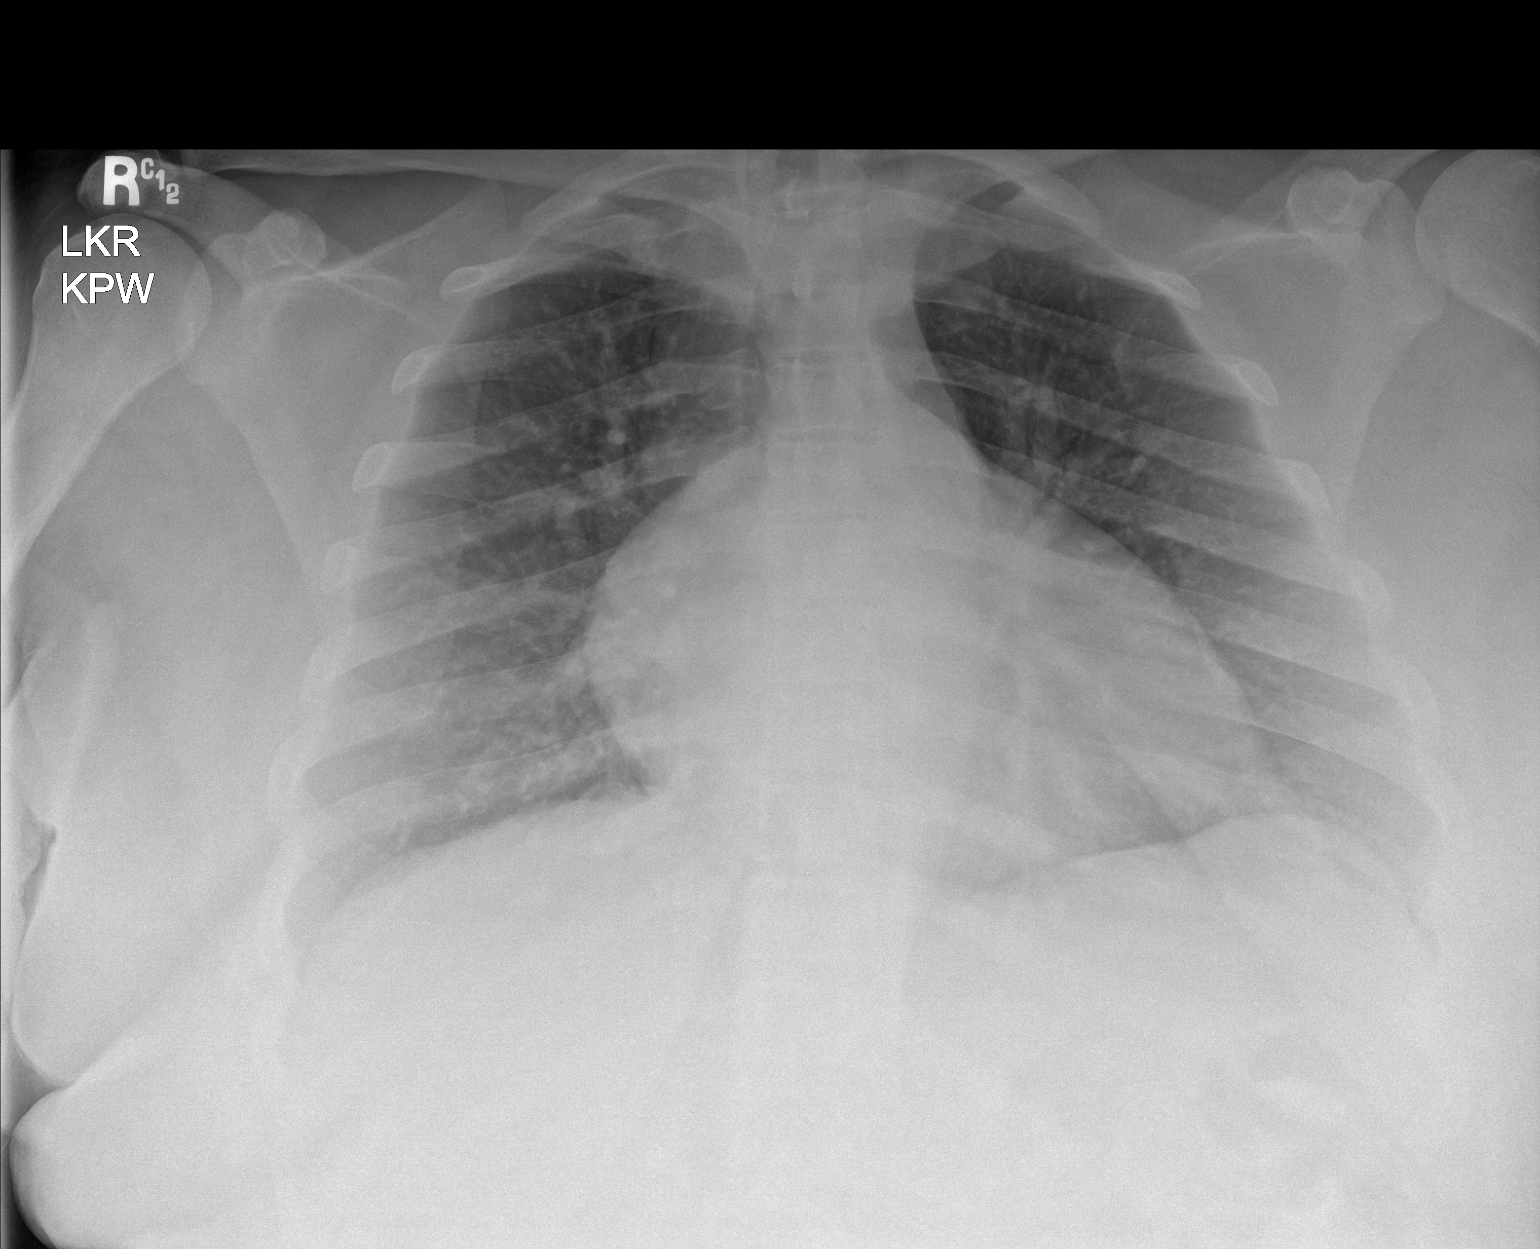

[1 of 1 positions shown; findings below may reference images not displayed]

FINDINGS: No edema or consolidation. Heart is upper normal in size with
pulmonary vascularity normal. No adenopathy. No bone lesions.
IMPRESSION: No edema or consolidation. Heart upper normal in size. No evident
adenopathy.
